# Patient Record
Sex: Female | Born: 1985 | Race: Black or African American | Hispanic: No | Marital: Single | Smoking: Former smoker
Health system: Southern US, Community
[De-identification: ages and names within clinical notes are randomized; demographics above are authoritative.]

## PROBLEM LIST (undated history)

## (undated) DIAGNOSIS — R600 Localized edema: Secondary | ICD-10-CM

## (undated) DIAGNOSIS — T7840XA Allergy, unspecified, initial encounter: Secondary | ICD-10-CM

## (undated) HISTORY — PX: CYST REMOVAL TRUNK: SHX6283

## (undated) HISTORY — DX: Allergy, unspecified, initial encounter: T78.40XA

---
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
## 2003-03-08 ENCOUNTER — Emergency Department (HOSPITAL_COMMUNITY): Admission: AD | Admit: 2003-03-08 | Discharge: 2003-03-08 | Payer: Self-pay | Admitting: Family Medicine

## 2004-03-28 ENCOUNTER — Other Ambulatory Visit: Admission: RE | Admit: 2004-03-28 | Discharge: 2004-03-28 | Payer: Self-pay | Admitting: Internal Medicine

## 2008-02-05 HISTORY — PX: BREAST BIOPSY: SHX20

## 2012-11-29 ENCOUNTER — Encounter (HOSPITAL_COMMUNITY): Payer: Self-pay | Admitting: Emergency Medicine

## 2012-11-29 ENCOUNTER — Emergency Department (HOSPITAL_COMMUNITY)
Admission: EM | Admit: 2012-11-29 | Discharge: 2012-11-29 | Disposition: A | Discharge status: Home / Self Care | Payer: No Typology Code available for payment source | Attending: Emergency Medicine | Admitting: Emergency Medicine

## 2012-11-29 DIAGNOSIS — S0180XA Unspecified open wound of other part of head, initial encounter: Secondary | ICD-10-CM | POA: Insufficient documentation

## 2012-11-29 DIAGNOSIS — J3489 Other specified disorders of nose and nasal sinuses: Secondary | ICD-10-CM | POA: Insufficient documentation

## 2012-11-29 DIAGNOSIS — S7000XA Contusion of unspecified hip, initial encounter: Secondary | ICD-10-CM | POA: Insufficient documentation

## 2012-11-29 DIAGNOSIS — Y92410 Unspecified street and highway as the place of occurrence of the external cause: Secondary | ICD-10-CM | POA: Insufficient documentation

## 2012-11-29 DIAGNOSIS — Y9389 Activity, other specified: Secondary | ICD-10-CM | POA: Insufficient documentation

## 2012-11-29 DIAGNOSIS — Z87891 Personal history of nicotine dependence: Secondary | ICD-10-CM | POA: Insufficient documentation

## 2012-11-29 DIAGNOSIS — S0181XA Laceration without foreign body of other part of head, initial encounter: Secondary | ICD-10-CM

## 2012-11-29 DIAGNOSIS — S7002XA Contusion of left hip, initial encounter: Secondary | ICD-10-CM

## 2012-11-29 MED ORDER — IBUPROFEN 600 MG PO TABS: 600.0000 mg | ORAL_TABLET | Freq: Four times a day (QID) | ORAL | Status: DC | PRN

## 2012-11-29 MED ORDER — IBUPROFEN 800 MG PO TABS
800.0000 mg | ORAL_TABLET | Freq: Once | ORAL | Status: AC
  Administered 2012-11-29: 800 mg via ORAL
  Filled 2012-11-29: qty 1

## 2012-11-29 MED ORDER — HYDROCODONE-ACETAMINOPHEN 5-325 MG PO TABS
1.0000 | ORAL_TABLET | Freq: Once | ORAL | Status: AC
  Administered 2012-11-29: 1 via ORAL
  Filled 2012-11-29: qty 1

## 2012-11-29 MED ORDER — CYCLOBENZAPRINE HCL 10 MG PO TABS: 10.0000 mg | ORAL_TABLET | Freq: Two times a day (BID) | ORAL | Status: DC | PRN

## 2012-11-29 MED ORDER — TRAMADOL HCL 50 MG PO TABS: 50.0000 mg | ORAL_TABLET | Freq: Four times a day (QID) | ORAL | Status: DC | PRN

## 2012-11-29 NOTE — ED Notes (Signed)
Pt walked in hallway and states that "it just feels sore." Pt also states that it hurts when she leans on it but doesn't other then that.

## 2012-11-29 NOTE — ED Notes (Signed)
Nurse called to room.  Pt has decided to have sutures in lieu of glue.  Needs pain med.  Notified PA.
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
## 2012-11-30 ENCOUNTER — Encounter (HOSPITAL_COMMUNITY): Payer: Self-pay | Admitting: Emergency Medicine

## 2012-11-30 ENCOUNTER — Emergency Department (HOSPITAL_COMMUNITY): Payer: No Typology Code available for payment source

## 2012-11-30 ENCOUNTER — Emergency Department (HOSPITAL_COMMUNITY)
Admission: EM | Admit: 2012-11-30 | Discharge: 2012-11-30 | Disposition: A | Discharge status: Home / Self Care | Payer: No Typology Code available for payment source | Attending: Emergency Medicine | Admitting: Emergency Medicine

## 2012-11-30 DIAGNOSIS — R209 Unspecified disturbances of skin sensation: Secondary | ICD-10-CM | POA: Insufficient documentation

## 2012-11-30 DIAGNOSIS — Z87891 Personal history of nicotine dependence: Secondary | ICD-10-CM | POA: Insufficient documentation

## 2012-11-30 DIAGNOSIS — M25559 Pain in unspecified hip: Secondary | ICD-10-CM | POA: Insufficient documentation

## 2012-11-30 DIAGNOSIS — G8911 Acute pain due to trauma: Secondary | ICD-10-CM | POA: Insufficient documentation

## 2012-11-30 MED ORDER — NAPROXEN 500 MG PO TABS: 500.0000 mg | ORAL_TABLET | Freq: Two times a day (BID) | ORAL | Status: DC

## 2012-11-30 MED ORDER — METHOCARBAMOL 500 MG PO TABS: 500.0000 mg | ORAL_TABLET | Freq: Two times a day (BID) | ORAL | Status: DC

## 2012-11-30 MED ORDER — HYDROCODONE-ACETAMINOPHEN 5-325 MG PO TABS: 1.0000 | ORAL_TABLET | Freq: Four times a day (QID) | ORAL | Status: DC | PRN
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
## 2012-12-03 NOTE — ED Provider Notes (Signed)
Medical screening examination/treatment/procedure(s) were performed by non-physician practitioner and as supervising physician I was immediately available for consultation/collaboration.  EKG Interpretation   None         Suzi Roots, MD 12/03/12 [phone]

## 2012-12-05 NOTE — ED Provider Notes (Signed)
Medical screening examination/treatment/procedure(s) were performed by non-physician practitioner and as supervising physician I was immediately available for consultation/collaboration.  EKG Interpretation   None       Devoria Albe, MD, Armando Gang   Ward Givens, MD 12/05/12 1538

## 2013-02-23 ENCOUNTER — Other Ambulatory Visit: Payer: Self-pay | Admitting: Physician Assistant

## 2013-02-23 ENCOUNTER — Other Ambulatory Visit (HOSPITAL_COMMUNITY)
Admission: RE | Admit: 2013-02-23 | Discharge: 2013-02-23 | Disposition: A | Discharge status: Home / Self Care | Payer: No Typology Code available for payment source | Source: Ambulatory Visit | Attending: Family Medicine | Admitting: Family Medicine

## 2013-02-23 DIAGNOSIS — Z124 Encounter for screening for malignant neoplasm of cervix: Secondary | ICD-10-CM | POA: Insufficient documentation
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
## 2014-08-17 ENCOUNTER — Other Ambulatory Visit: Payer: Self-pay | Admitting: Obstetrics and Gynecology

## 2014-08-17 ENCOUNTER — Other Ambulatory Visit (HOSPITAL_COMMUNITY)
Admission: RE | Admit: 2014-08-17 | Discharge: 2014-08-17 | Disposition: A | Discharge status: Home / Self Care | Payer: 59 | Source: Ambulatory Visit | Attending: Obstetrics and Gynecology | Admitting: Obstetrics and Gynecology

## 2014-08-17 DIAGNOSIS — Z1151 Encounter for screening for human papillomavirus (HPV): Secondary | ICD-10-CM | POA: Insufficient documentation

## 2014-08-17 DIAGNOSIS — Z01419 Encounter for gynecological examination (general) (routine) without abnormal findings: Secondary | ICD-10-CM | POA: Diagnosis not present

## 2014-08-22 LAB — CYTOLOGY - PAP

## 2016-11-15 ENCOUNTER — Encounter: Payer: Self-pay | Admitting: Internal Medicine

## 2016-11-19 NOTE — Progress Notes (Signed)
Complete Physical  Assessment and Plan: Health Maintenance- Discussed STD testing, safe sex, alcohol and drug awareness, drinking and driving dangers, wearing a seat belt and general safety measures for young adult.  Adriana Carr was seen today for new patient (initial visit).  Diagnoses and all orders for this visit:  Encounter for routine history and physical exam in female      -      OBGYN referral requested and provided  Establishing care with new doctor, encounter for       Eczema, unspecified type      -      Minor, currently managed without prescriptions  Screening examination for pulmonary tuberculosis -     PPD  Medication management -     CBC with Differential/Platelet -     BASIC METABOLIC PANEL WITH GFR -     Hepatic function panel -     Urinalysis w microscopic + reflex cultur  Fatigue, unspecified type -     Iron,Total/Total Iron Binding Cap -     Ferritin -     CBC -     TSH  Screening for deficiency anemia -     CBC with Differential/Platelet -     Vitamin B12 -     Iron,Total/Total Iron Binding Cap -     Ferritin  Screening cholesterol level -     Lipid panel  Screening for cardiovascular condition -     CBC with Differential/Platelet -     BASIC METABOLIC PANEL WITH GFR -     Lipid panel  Screening for diabetes mellitus -     Hemoglobin A1c  Screening for hematuria or proteinuria -     Urinalysis w microscopic + reflex cultur  Screening for thyroid disorder -     TSH  Vitamin D deficiency -     VITAMIN D 25 Hydroxy (Vit-D Deficiency, Fractures)  Other fatigue -     TSH -     VITAMIN D 25 Hydroxy (Vit-D Deficiency, Fractures) -     Vitamin B12 -     Iron,Total/Total Iron Binding Cap -     Ferritin  Screening for STDs (sexually transmitted diseases) -     HIV antibody -     RPR -     C. trachomatis/N. gonorrhoeae RNA  BMI 38.0-38.9,adult       -     Current weight 214 lb reportedly down from 236 lb; new goal 200 lb       -      Discussed current diet/lifestyle       -     Patient to taper off of mountain dew over the next month       -     Start food diary       -     Information for support medications provided, will initiate at follow up for weight in 4 weeks  Discussed med's effects and SE's. Screening labs and tests as requested with regular follow-up as recommended. 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 She endorses some fatigue that she attributes to this. Her past medical history includes mild eczema ongoing from childhood that she treats with OTC agents, and a lump to her left breast noted 8 years ago which was screened via [place] and [facility] then biopsied; she reports this was "keloidal" and was not recommended any follow up. She does request a referral for GYN and breast care which will be provided today.   BMI is Body mass index is 38.05 kg/m., she is working on diet and exercise, and reportedly has lost 22 lbs since the beginning of the summer through a weight loss clinic. She is interested in continuing with weight loss support through this clinic. She reports she was placed on phentermine and also a "water pill" to assist with weight loss.   Wt Readings from Last 3 Encounters:  11/21/16 214 lb 12.8 oz (97.4 kg)   Diet: avoids sweets, breads, fried foods. She feels her problem is sweetened drink- mountain dew daily (16.9 oz x 1-2 per day). Discussed cutting this out. She would like to set an initial goal of 14 lb weight loss.   She does workout. Dancing, zumba,  cardio at gym. Denies CP, SOB.   Current Medications:  Current Outpatient Prescriptions on File Prior to Visit  Medication Sig Dispense Refill  . ibuprofen (ADVIL,MOTRIN) 600 MG tablet Take 1 tablet (600 mg total) by mouth every 6 (six) hours as needed for pain. 30 tablet 0   No current facility-administered medications on file prior to visit.    Health Maintenance:   Immunization History  Administered Date(s) Administered  . PPD Test 11/21/2016  . Td 02/05/2012    TD/TDAP: 2015 Influenza: Declines Pneumovax: n/a Prevnar 13: n/a HPV vaccines: n/a  LMP: 11/01/2016 Sexually Active: last in 09/2016 STD testing offered - will obtain what we can without pelvic today Pap: 2017 in [place] MGM: Bilateral diagnostic 2013 related to L breast lump  Allergies: No Known Allergies Medical History:  has Eczema; Fatigue; and BMI 38.0-38.9,adult on her problem list. Surgical History:  She  has a past surgical history that includes Cyst removal trunk (lower back) and Breast biopsy (Left, 2010). Family History:  Her She was adopted. Family history is unknown by patient. Social History:   reports that she quit smoking about 13 years ago. She has never used smokeless tobacco. She reports that she does not drink alcohol or use drugs.  Review of Systems: Review of Systems  Constitutional: Positive for malaise/fatigue. Negative for weight loss.  HENT: Negative for congestion, hearing loss, nosebleeds and tinnitus.   Eyes: Negative for blurred vision.  Respiratory: Negative for cough, shortness of breath and wheezing.   Cardiovascular: Negative for chest pain, palpitations and leg swelling.  Gastrointestinal: Negative for abdominal pain, blood in stool, constipation, diarrhea, heartburn, melena, nausea and vomiting.  Genitourinary: Negative.   Musculoskeletal: Negative for joint pain and myalgias.  Skin: Negative.  Negative for rash.  Neurological: Negative for dizziness, sensory change and  headaches.  Endo/Heme/Allergies: Positive for environmental allergies. Negative for polydipsia. Does not bruise/bleed easily.  Psychiatric/Behavioral: Negative.  Negative for depression and substance abuse. The patient is not nervous/anxious and does not have insomnia.     Physical Exam: Estimated body mass index is 38.05 kg/m as calculated from the following:   Height as of this encounter:  (1.6 m).   Weight as of this encounter: 214 lb 12.8 oz (97.4 kg). BP 122/76   Pulse  76   Temp (!) 97 F (36.1 C)   Resp 18   Ht  (1.6 m)   Wt 214 lb 12.8 oz (97.4 kg)   BMI 38.05 kg/m  General Appearance: Well nourished, overweight female in no apparent distress.  Eyes: PERRLA, EOMs, conjunctiva no swelling or erythema, normal fundi and vessels.  Sinuses: No Frontal/maxillary tenderness  ENT/Mouth: Ext aud canals clear, normal light reflex with TMs without erythema, bulging. Good dentition. No erythema, swelling, or exudate on post pharynx. Tonsils not swollen or erythematous. Hearing normal.  Neck: Supple, thyroid normal. No bruits  Respiratory: Respiratory effort normal, BS equal bilaterally without rales, rhonchi, wheezing or stridor.  Cardio: RRR without murmurs, rubs or gallops. Brisk peripheral pulses without edema.  Chest: symmetric, with normal excursions and percussion.  Breasts: Defer to GYN Abdomen: Soft, nontender, no guarding, rebound, hernias, masses, or organomegaly.  Lymphatics: Non tender without lymphadenopathy.  Genitourinary: Defer to GYN Musculoskeletal: Full ROM all peripheral extremities,5/5 strength, and normal gait.  Skin: Warm, dry without rashes, lesions, ecchymosis. Neuro: Cranial nerves intact, reflexes equal bilaterally. Normal muscle tone, no cerebellar symptoms. Sensation intact.  Psych: Awake and oriented X 3, normal affect, Insight and Judgment appropriate.   PHQ-2: 0  EKG: defer  Dan Maker 10:17 PM [facility]

## 2016-11-21 ENCOUNTER — Encounter: Payer: Self-pay | Admitting: Adult Health

## 2016-11-21 ENCOUNTER — Ambulatory Visit (INDEPENDENT_AMBULATORY_CARE_PROVIDER_SITE_OTHER): Payer: BC Managed Care – PPO | Admitting: Adult Health

## 2016-11-21 VITALS — BP 122/76 | HR 76 | Temp 97.0°F | Resp 18 | Ht 63.0 in | Wt 214.8 lb

## 2016-11-21 DIAGNOSIS — Z1389 Encounter for screening for other disorder: Secondary | ICD-10-CM

## 2016-11-21 DIAGNOSIS — R5383 Other fatigue: Secondary | ICD-10-CM

## 2016-11-21 DIAGNOSIS — Z111 Encounter for screening for respiratory tuberculosis: Secondary | ICD-10-CM

## 2016-11-21 DIAGNOSIS — Z6838 Body mass index (BMI) 38.0-38.9, adult: Secondary | ICD-10-CM

## 2016-11-21 DIAGNOSIS — Z130 Encounter for screening for diseases of the blood and blood-forming organs and certain disorders involving the immune mechanism: Secondary | ICD-10-CM

## 2016-11-21 DIAGNOSIS — Z7689 Persons encountering health services in other specified circumstances: Secondary | ICD-10-CM

## 2016-11-21 DIAGNOSIS — Z1329 Encounter for screening for other suspected endocrine disorder: Secondary | ICD-10-CM

## 2016-11-21 DIAGNOSIS — Z6841 Body Mass Index (BMI) 40.0 and over, adult: Secondary | ICD-10-CM | POA: Insufficient documentation

## 2016-11-21 DIAGNOSIS — Z1159 Encounter for screening for other viral diseases: Secondary | ICD-10-CM

## 2016-11-21 DIAGNOSIS — Z0000 Encounter for general adult medical examination without abnormal findings: Secondary | ICD-10-CM | POA: Diagnosis not present

## 2016-11-21 DIAGNOSIS — L309 Dermatitis, unspecified: Secondary | ICD-10-CM

## 2016-11-21 DIAGNOSIS — Z136 Encounter for screening for cardiovascular disorders: Secondary | ICD-10-CM

## 2016-11-21 DIAGNOSIS — Z79899 Other long term (current) drug therapy: Secondary | ICD-10-CM

## 2016-11-21 DIAGNOSIS — E559 Vitamin D deficiency, unspecified: Secondary | ICD-10-CM | POA: Diagnosis not present

## 2016-11-21 DIAGNOSIS — Z131 Encounter for screening for diabetes mellitus: Secondary | ICD-10-CM

## 2016-11-21 DIAGNOSIS — Z113 Encounter for screening for infections with a predominantly sexual mode of transmission: Secondary | ICD-10-CM

## 2016-11-21 DIAGNOSIS — Z13220 Encounter for screening for lipoid disorders: Secondary | ICD-10-CM

## 2016-11-21 NOTE — Patient Instructions (Signed)
Taper down on soda until no longer drinking on a regular basis.   Who Qualifies for Obesity Medications? Although everyone is hopeful for a fast and easy way to lose weight, nothing has been shown to replace a prudent, calorie-controlled diet along with behavior modification as a cornerstone for all obesity treatments.  The next tool that can be used to achieve weight-loss and health improvement is medication. Pharmacotherapy may be offered to individuals affected by obesity who have failed to achieve weight-loss through diet and exercise alone. Currently there are several drugs that are approved by the FDA for weight-loss: phentermine products (Adipex-P or Suprenza)  lorcaserin HCI (Belviq) phentermine- topiramate ER (Qsymia)  Bupropion; Naltrexone ER (Contrave)  Saxenda (liraglutide), once a day weight loss shot Let's take a closer look at each of these medications and learn how they work:  Phentermine (Adipex-P or Suprenza) How does it work? Phentermine is a medication available by prescription that works on chemicals in the brain to decrease your appetite. It also has a mild stimulant component that adds extra energy. Phentermine is a pill that is taken once a day in the morning time. Tolerance to this medication can develop, so it can only be used for several months at a time. Common side effects are dry mouth, sleeplessness, constipation. Weight-loss: The average weight-loss is 4-5 percent of your weight after one-year. In a 200 pound person, this means about 10 pounds of weight-loss. Patients who receive phentermine can usually expect to see greater weight-loss than those who receive non-pharmacologic care, on average about 13 pounds difference over 12 weeks as reported in one study. Concerns: Due to its stimulant effect, a person's blood pressure and heart rate may increase when on this medication; therefore, you must be monitored closely by a physician who is experienced in  prescribing this medication. It cannot be used in patients with some heart conditions (such as poorly controlled blood pressure), glaucoma (increased pressure in your eye), stroke or overactive thyroid. There is some concern for abuse, but this is minimal if the medication is appropriately used as directed by a healthcare professional.  Lorcaserin (Belviq) How does it work? Lorcaserin was approved in June 2012 by the FDA and became commercially available in June 2013. It works by helping you feel full while eating less, and it works on the chemicals in your brain to help decrease your appetite. Weight-loss: In individuals who took the medication for one-year, it has been shown to have an average of 7 percent weight-loss. In a 200 pound person, this would mean a 14 pound weight-loss. Blood sugar, cholesterol and blood pressure levels have also been shown to improve. Concerns: The most common side effects are headache, dizziness, fatigue, dry mouth, upper UEA:[id number] tract infection and nausea.  Response to therapy should be evaluated by week 12.  If a patient has not lost at least 5% of baseline body weigh  Phentermine-Topiramate ER (Qsymia) How does it work? This combination medication was approved by the FDA in July 2012. Topiramate is a medication used to treat seizures. It was found that a common side effect of this medication was weight-loss. Phentermine, as described in this brochure, helps to increase your energy and decrease your appetite. Weight-loss: Among individuals who took the highest does of Qsymia (15 mg phentermine and 92 mg of topiramate ER) for one-year, they achieved an average of 14.4 percent weight-loss. In a 200 pound person, a 14.4 percent weight-loss would mean a loss of 29 pounds. Cholesterol levels have also  been shown to improve. Concerns: The most common side effects were dry mouth, constipation and pins-and-needle feeling in extremities. Qsymia should NOT be taken  during pregnancy since Topiramate ER, a component of Qsymia, has been associated with an increased risk of birth defects.  Bupropion; Naltrexone ER (Contrave) How does it work? Works in two areas of your brain, hunger center and reward center to reduce hunger and cravings.  Weight loss In a 56 week trial patients lost more than 5% of their body weight.  Concerns Most common side effects are dry mouth, constipation or diarrhea, headache.  Please take it with a full glass of water and low fat meal.      Follow-up Visits: Patients are given the opportunity to revisit a topic or obtain more information on an area of interest during follow-up visits. The frequency of and interval between follow-up visits is determined on a patient-by-patient basis. Frequent visits (every 3 to 4 weeks) are encouraged until initial weight-loss goals (5 to 10 percent of body weight) are achieved. At that point, less frequent visits are typically scheduled as needed for individual patients. However, since obesity is considered a chronic life-long problem for many individuals, periodic continual follow up is recommended.   Research has shown that weight-loss as low as 5 percent of initial body weight can lead to favorable improvements in blood pressure, cholesterol, glucose levels and insulin sensitivity. The risk of developing heart disease is reduced the most in patients who have impaired glucose tolerance, type 2 diabetes or high blood pressure.    Before you even begin to attack a weight-loss plan, it pays to remember this: You are not fat. You have fat. Losing weight isn't about blame or shame; it's simply another achievement to accomplish. Dieting is like any other skill-you have to buckle down and work at it. As long as you act in a smart, reasonable way, you'll ultimately get where you want to be. Here are some weight loss pearls for you.  1. It's Not a Diet. It's a Lifestyle Thinking of a diet as something  you're on and suffering through only for the short term doesn't work. To shed weight and keep it off, you need to make permanent changes to the way you eat. It's OK to indulge occasionally, of course, but if you cut calories temporarily and then revert to your old way of eating, you'll gain back the weight quicker than you can say yo-yo. Use it to lose it. Research shows that one of the best predictors of long-term weight loss is how many pounds you drop in the first month. For that reason, nutritionists often suggest being stricter for the first two weeks of your new eating strategy to build momentum. Cut out added sugar and alcohol and avoid unrefined carbs. After that, figure out how you can reincorporate them in a way that's healthy and maintainable.  2. There's a Right Way to Exercise Working out burns calories and fat and boosts your metabolism by building muscle. But those trying to lose weight are notorious for overestimating the number of calories they burn and underestimating the amount they take in. Unfortunately, your system is biologically programmed to hold on to extra pounds and that means when you start exercising, your body senses the deficit and ramps up its hunger signals. If you're not diligent, you'll eat everything you burn and then some. Use it to lose it. Cardio gets all the exercise glory, but strength and interval training are the real heroes.  They help you build lean muscle, which in turn increases your metabolism and calorie-burning ability 3. Don't Overreact to Mild Hunger Some people have a hard time losing weight because of hunger anxiety. To them, being hungry is bad-something to be avoided at all costs-so they carry snacks with them and eat when they don't need to. Others eat because they're stressed out or bored. While you never want to get to the point of being ravenous (that's when bingeing is likely to happen), a hunger pang, a craving, or the fact that it's 3:00 p.m.  should not send you racing for the vending machine or obsessing about the energy bar in your purse. Ideally, you should put off eating until your stomach is growling and it's difficult to concentrate.  Use it to lose it. When you feel the urge to eat, use the HALT method. Ask yourself, Am I really hungry? Or am I angry or anxious, lonely or bored, or tired? If you're still not certain, try the apple test. If you're truly hungry, an apple should seem delicious; if it doesn't, something else is going on. Or you can try drinking water and making yourself busy, if you are still hungry try a healthy snack.  4. Not All Calories Are Created Equal The mechanics of weight loss are pretty simple: Take in fewer calories than you use for energy. But the kind of food you eat makes all the difference. Processed food that's high in saturated fat and refined starch or sugar can cause inflammation that disrupts the hormone signals that tell your brain you're full. The result: You eat a lot more.  Use it to lose it. Clean up your diet. Swap in whole, unprocessed foods, including vegetables, lean protein, and healthy fats that will fill you up and give you the biggest nutritional bang for your calorie buck. In a few weeks, as your brain starts receiving regular hunger and fullness signals once again, you'll notice that you feel less hungry overall and naturally start cutting back on the amount you eat.  5. Protein, Produce, and Plant-Based Fats Are Your Weight-Loss Trinity Here's why eating the three Ps regularly will help you drop pounds. Protein fills you up. You need it to build lean muscle, which keeps your metabolism humming so that you can torch more fat. People in a weight-loss program who ate double the recommended daily allowance for protein (about 110 grams for a 150-pound woman) lost 70 percent of their weight from fat, while people who ate the RDA lost only about 40 percent, one study found. Produce is packed with  filling fiber. "It's very difficult to consume too many calories if you're eating a lot of vegetables. Example: Three cups of broccoli is a lot of food, yet only 93 calories. (Fruit is another story. It can be easy to overeat and can contain a lot of calories from sugar, so be sure to monitor your intake.) Plant-based fats like olive oil and those in avocados and nuts are healthy and extra satiating.  Use it to lose it. Aim to incorporate each of the three Ps into every meal and snack. People who eat protein throughout the day are able to keep weight off, according to a study in the American Journal of Clinical Nutrition. In addition to meat, poultry and seafood, good sources are beans, lentils, eggs, tofu, and yogurt. As for fat, keep portion sizes in check by measuring out salad dressing, oil, and nut butters (shoot for one to two tablespoons).  Finally, eat veggies or a little fruit at every meal. People who did that consumed 308 fewer calories but didn't feel any hungrier than when they didn't eat more produce.  7. How You Eat Is As Important As What You Eat In order for your brain to register that you're full, you need to focus on what you're eating. Sit down whenever you eat, preferably at a table. Turn off the TV or computer, put down your phone, and look at your food. Smell it. Chew slowly, and don't put another bite on your fork until you swallow. When women ate lunch this attentively, they consumed 30 percent less when snacking later than those who listened to an audiobook at lunchtime, according to a study in the [place] Journal of Nutrition. 8. Weighing Yourself Really Works The scale provides the best evidence about whether your efforts are paying off. Seeing the numbers tick up or down or stagnate is motivation to keep going-or to rethink your approach. A 2015 study at [facility] found that daily weigh-ins helped people lose more weight, keep it off, and maintain that loss, even after two  years. Use it to lose it. Step on the scale at the same time every day for the best results. If your weight shoots up several pounds from one weigh-in to the next, don't freak out. Eating a lot of salt the night before or having your period is the likely culprit. The number should return to normal in a day or two. It's a steady climb that you need to do something about. 9. Too Much Stress and Too Little Sleep Are Your Enemies When you're tired and frazzled, your body cranks up the production of cortisol, the stress hormone that can cause carb cravings. Not getting enough sleep also boosts your levels of ghrelin, a hormone associated with hunger, while suppressing leptin, a hormone that signals fullness and satiety. People on a diet who slept only five and a half hours a night for two weeks lost 55 percent less fat and were hungrier than those who slept eight and a half hours, according to a study in the [place] [employer] Journal. Use it to lose it. Prioritize sleep, aiming for seven hours or more a night, which research shows helps lower stress. And make sure you're getting quality zzz's. If a snoring spouse or a fidgety cat wakes you up frequently throughout the night, you may end up getting the equivalent of just four hours of sleep, according to a study from [facility]. Keep pets out of the bedroom, and use a white-noise app to drown out snoring. 10. You Will Hit a plateau-And You Can Bust Through It As you slim down, your body releases much less leptin, the fullness hormone.  If you're not strength training, start right now. Building muscle can raise your metabolism to help you overcome a plateau. To keep your body challenged and burning calories, incorporate new moves and more intense intervals into your workouts or add another sweat session to your weekly routine. Alternatively, cut an extra 100 calories or so a day from your diet. Now that you've lost weight, your body simply doesn't  need as much fuel.

## 2016-11-22 LAB — CBC WITH DIFFERENTIAL/PLATELET
Basophils Absolute: 44 cells/uL (ref 0–200)
Basophils Relative: 0.5 %
EOS PCT: 1.3 %
Eosinophils Absolute: 114 cells/uL (ref 15–500)
HCT: 37.2 % (ref 35.0–45.0)
HEMOGLOBIN: 12.4 g/dL (ref 11.7–15.5)
Lymphs Abs: 3212 cells/uL (ref 850–3900)
MCH: 27.6 pg (ref 27.0–33.0)
MCHC: 33.3 g/dL (ref 32.0–36.0)
MCV: 82.9 fL (ref 80.0–100.0)
MPV: 10.2 fL (ref 7.5–12.5)
Monocytes Relative: 5.9 %
NEUTROS ABS: 4910 {cells}/uL (ref 1500–7800)
Neutrophils Relative %: 55.8 %
Platelets: 405 10*3/uL — ABNORMAL HIGH (ref 140–400)
RBC: 4.49 10*6/uL (ref 3.80–5.10)
RDW: 12 % (ref 11.0–15.0)
Total Lymphocyte: 36.5 %
WBC mixed population: 519 cells/uL (ref 200–950)
WBC: 8.8 10*3/uL (ref 3.8–10.8)

## 2016-11-22 LAB — HIV ANTIBODY (ROUTINE TESTING W REFLEX): HIV 1&2 Ab, 4th Generation: NONREACTIVE

## 2016-11-22 LAB — HEPATIC FUNCTION PANEL
AG RATIO: 1.4 (calc) (ref 1.0–2.5)
ALBUMIN MSPROF: 4.4 g/dL (ref 3.6–5.1)
ALT: 10 U/L (ref 6–29)
AST: 13 U/L (ref 10–30)
Alkaline phosphatase (APISO): 69 U/L (ref 33–115)
BILIRUBIN TOTAL: 0.2 mg/dL (ref 0.2–1.2)
Bilirubin, Direct: 0 mg/dL (ref 0.0–0.2)
Globulin: 3.1 g/dL (calc) (ref 1.9–3.7)
Indirect Bilirubin: 0.2 mg/dL (calc) (ref 0.2–1.2)
TOTAL PROTEIN: 7.5 g/dL (ref 6.1–8.1)

## 2016-11-22 LAB — LIPID PANEL
Cholesterol: 147 mg/dL (ref <200)
HDL: 51 mg/dL (ref >50)
LDL Cholesterol (Calc): 79 mg/dL (calc)
Non-HDL Cholesterol (Calc): 96 mg/dL (calc) (ref <130)
Total CHOL/HDL Ratio: 2.9 (calc) (ref <5.0)
Triglycerides: 87 mg/dL (ref <150)

## 2016-11-22 LAB — RPR: RPR Ser Ql: NONREACTIVE

## 2016-11-22 LAB — URINALYSIS W MICROSCOPIC + REFLEX CULTURE
BILIRUBIN URINE: NEGATIVE
Bacteria, UA: NONE SEEN /HPF
Glucose, UA: NEGATIVE
Hgb urine dipstick: NEGATIVE
Hyaline Cast: NONE SEEN /LPF
KETONES UR: NEGATIVE
LEUKOCYTE ESTERASE: NEGATIVE
NITRITES URINE, INITIAL: NEGATIVE
PH: 6 (ref 5.0–8.0)
Protein, ur: NEGATIVE
RBC / HPF: NONE SEEN /HPF (ref 0–2)
SPECIFIC GRAVITY, URINE: 1.025 (ref 1.001–1.03)
WBC, UA: NONE SEEN /HPF (ref 0–5)

## 2016-11-22 LAB — VITAMIN B12: VITAMIN B 12: 370 pg/mL (ref 200–1100)

## 2016-11-22 LAB — TEST AUTHORIZATION

## 2016-11-22 LAB — BASIC METABOLIC PANEL WITH GFR
BUN: 12 mg/dL (ref 7–25)
CALCIUM: 9.4 mg/dL (ref 8.6–10.2)
CO2: 25 mmol/L (ref 20–32)
CREATININE: 0.76 mg/dL (ref 0.50–1.10)
Chloride: 103 mmol/L (ref 98–110)
GFR, EST NON AFRICAN AMERICAN: 103 mL/min/{1.73_m2} (ref >=60)
GFR, Est African American: 119 mL/min/{1.73_m2} (ref >=60)
Glucose, Bld: 89 mg/dL (ref 65–99)
Potassium: 4 mmol/L (ref 3.5–5.3)
Sodium: 136 mmol/L (ref 135–146)

## 2016-11-22 LAB — HEMOGLOBIN A1C
EAG (MMOL/L): 6 (calc)
Hgb A1c MFr Bld: 5.4 % of total Hgb (ref <5.7)
MEAN PLASMA GLUCOSE: 108 (calc)

## 2016-11-22 LAB — IRON,TIBC AND FERRITIN PANEL
%SAT: 14 % (calc) (ref 11–50)
FERRITIN: 32 ng/mL (ref 10–154)
IRON: 49 ug/dL (ref 40–190)
TIBC: 341 ug/dL (ref 250–450)

## 2016-11-22 LAB — NO CULTURE INDICATED

## 2016-11-22 LAB — VITAMIN D 25 HYDROXY (VIT D DEFICIENCY, FRACTURES): VIT D 25 HYDROXY: 12 ng/mL — AB (ref 30–100)

## 2016-11-22 LAB — TSH: TSH: 1.97 mIU/L

## 2016-11-23 ENCOUNTER — Encounter: Payer: Self-pay | Admitting: Adult Health

## 2016-11-23 DIAGNOSIS — R7989 Other specified abnormal findings of blood chemistry: Secondary | ICD-10-CM | POA: Insufficient documentation

## 2016-11-23 DIAGNOSIS — E559 Vitamin D deficiency, unspecified: Secondary | ICD-10-CM | POA: Insufficient documentation

## 2016-11-23 LAB — C. TRACHOMATIS/N. GONORRHOEAE RNA
C. TRACHOMATIS RNA, TMA: NOT DETECTED
N. gonorrhoeae RNA, TMA: NOT DETECTED

## 2016-11-25 ENCOUNTER — Telehealth: Payer: Self-pay

## 2016-11-26 NOTE — Telephone Encounter (Signed)
See previous encounter

## 2016-12-24 NOTE — Progress Notes (Signed)
Assessment and Plan:  Morbid Obesity with co morbidities Long discussion about weight loss, diet, and exercise Discussed goal weight (below 170) and initial weight loss goal (200 lb) Patient will work on increasing fluids to 80-100 + fluid ounces, tracking calories in app Will start the patient on phentermine- hand out given and AE's discussed, will do close follow up. Return in 3 months.  -     phentermine (ADIPEX-P) 37.5 MG tablet; Take 1 tablet (37.5 mg total) by mouth daily before breakfast. Take 1/3-1/2 tab few days to a week, titrate up to 1 tab in the morning as tolerated. May take an additional 1/3-1/2 at lunch to cover evening, if tolerated without sleep disturbances.  Fluid retention Restarting previous medication after verification of dose -     triamterene-hydrochlorothiazide (MAXZIDE) 75-50 MG tablet; Take 1 tablet by mouth daily.  Further disposition pending results of labs. Discussed med's effects and SE's.   Over 15 minutes of exam, counseling, chart review, and critical decision making was performed.   No future appointments.  ------------------------------------------------------------------------------------------------------------------   HPI BP 124/74   Pulse 96   Temp (!) 97.5 F (36.4 C)   Ht 5\' 3"  (1.6 m)   Wt 218 lb 9.6 oz (99.2 kg)   LMP 11/29/2016 (Exact Date)   SpO2 99%   BMI 38.72 kg/m   31 y.o.female, recently established at this clinic after moving back to the area presents for monitoring of weight and discussion for initiating phentermine for weight loss support. She was previously managed at a weight loss clinic over the summer and reportedly lost lost 22 lbs since the beginning of the summer while on phentermine; she denies having palpitations, anxiety, trouble sleeping, elevated BP while on the medication.   She is interested in continuing with weight loss support through this clinic. She was also previously on a medication for fluid retention which  she could not recall at last visit; presents with prescription today for refill.   BMI is Body mass index is 38.72 kg/m., she is working on diet and exercise. Wt Readings from Last 3 Encounters:  12/25/16 218 lb 9.6 oz (99.2 kg)  11/21/16 214 lb 12.8 oz (97.4 kg)   She plans to start attending GYM three times a week - Has cut back on mountain dew, plans to continue tapering and cut out completely by January.  Plans to start herbal life protein shake for breakfast, eating more consistently to avoid extremely large meals.   Past Medical History:  Diagnosis Date  . Allergy      No Known Allergies  Current Outpatient Medications on File Prior to Visit  Medication Sig  . Cholecalciferol (VITAMIN D-3) 5000 units TABS Take by mouth.  Marland Kitchen. ibuprofen (ADVIL,MOTRIN) 600 MG tablet Take 1 tablet (600 mg total) by mouth every 6 (six) hours as needed for pain.  . Multiple Vitamin (MULTIVITAMIN) tablet Take 1 tablet by mouth daily.  . Prenatal Vit-Fe Fumarate-FA (M-VIT) tablet Take 1 tablet by mouth daily.   No current facility-administered medications on file prior to visit.     ROS: Review of Systems  Constitutional: Negative for malaise/fatigue and weight loss.  HENT: Negative for hearing loss and tinnitus.   Eyes: Negative for blurred vision and double vision.  Respiratory: Negative for cough, shortness of breath and wheezing.   Cardiovascular: Negative for chest pain, palpitations, orthopnea, claudication and leg swelling.  Gastrointestinal: Negative for abdominal pain, blood in stool, constipation, diarrhea, heartburn, melena, nausea and vomiting.  Genitourinary: Negative.  Musculoskeletal: Negative for joint pain and myalgias.  Skin: Negative for rash.  Neurological: Negative for dizziness, tingling, sensory change, weakness and headaches.  Endo/Heme/Allergies: Negative for polydipsia.  Psychiatric/Behavioral: Negative.   All other systems reviewed and are negative.    Physical  Exam:  BP 124/74   Pulse 96   Temp (!) 97.5 F (36.4 C)   Ht 5\' 3"  (1.6 m)   Wt 218 lb 9.6 oz (99.2 kg)   LMP 11/29/2016 (Exact Date)   SpO2 99%   BMI 38.72 kg/m   General Appearance: Well nourished, in no apparent distress. Eyes: PERRLA, EOMs, conjunctiva no swelling or erythema Sinuses: No Frontal/maxillary tenderness ENT/Mouth: Ext aud canals clear, TMs without erythema, bulging. No erythema, swelling, or exudate on post pharynx.  Tonsils not swollen or erythematous. Hearing normal.  Neck: Supple, thyroid normal.  Respiratory: Respiratory effort normal, BS equal bilaterally without rales, rhonchi, wheezing or stridor.  Cardio: RRR with no MRGs. Brisk peripheral pulses with mild nonpitting edema to bilateral ankles.  Abdomen: Soft, + BS.  Non tender, no guarding, rebound, hernias, masses. Lymphatics: Non tender without lymphadenopathy.  Musculoskeletal: Full ROM, 5/5 strength, normal gait.  Skin: Warm, dry without rashes, lesions, ecchymosis.  Neuro: Cranial nerves intact. Normal muscle tone, no cerebellar symptoms. Sensation intact.  Psych: Awake and oriented X 3, normal affect, Insight and Judgment appropriate.     Adriana MakerAshley C Jafar Poffenberger, Adriana Carr 4:40 PM [facility]

## 2016-12-25 ENCOUNTER — Ambulatory Visit: Payer: BC Managed Care – PPO | Admitting: Adult Health

## 2016-12-25 ENCOUNTER — Encounter: Payer: Self-pay | Admitting: Adult Health

## 2016-12-25 VITALS — BP 124/74 | HR 96 | Temp 97.5°F | Ht 63.0 in | Wt 218.6 lb

## 2016-12-25 DIAGNOSIS — Z6838 Body mass index (BMI) 38.0-38.9, adult: Secondary | ICD-10-CM | POA: Diagnosis not present

## 2016-12-25 DIAGNOSIS — R609 Edema, unspecified: Secondary | ICD-10-CM | POA: Diagnosis not present

## 2016-12-25 MED ORDER — TRIAMTERENE-HCTZ 75-50 MG PO TABS: 1.0000 | ORAL_TABLET | Freq: Every day | ORAL | 11 refills | Status: DC

## 2016-12-25 MED ORDER — PHENTERMINE HCL 37.5 MG PO TABS: 37.5000 mg | ORAL_TABLET | Freq: Every day | ORAL | 2 refills | Status: DC

## 2016-12-25 NOTE — Patient Instructions (Addendum)
Get a water bottle - drink 80-100 fluid ounces of water or unsweetened tea daily.   Track calories on app - don't go too low - usually minimum 1200  Phentermine  While taking the medication we may ask that you come into the office once a month or once every 2-3 months to monitor your weight, blood pressure, and heart rate. In addition we can help answer your questions about diet, exercise, and help you every step of the way with your weight loss journey. Sometime it is helpful if you bring in a food diary or use an app on your phone such as myfitnesspal to record your calorie intake, especially in the beginning.   You can start out on 1/3 to 1/2 a pill in the morning and if you are tolerating it well you can increase to one pill daily. I also have some patients that take 1/3 or 1/2 at lunch to help prevent night time eating.  This medication [facility] is 14-17 dollars and you do NOT need a membership to get meds from there.    What is this medicine? PHENTERMINE (FEN ter meen) decreases your appetite. This medicine is intended to be used in addition to a healthy reduced calorie diet and exercise. The best results are achieved this way. This medicine is only indicated for short-term use. Eventually your weight loss may level out and the medication will no longer be needed.   How should I use this medicine? Take this medicine by mouth. Follow the directions on the prescription label. The tablets should stay in the bottle until immediately before you take your dose. Take your doses at regular intervals. Do not take your medicine more often than directed.  Overdosage: If you think you have taken too much of this medicine contact a poison control center or emergency room at once. NOTE: This medicine is only for you. Do not share this medicine with others.  What if I miss a dose? If you miss a dose, take it as soon as you can. If it is almost time for your  next dose, take only that dose. Do not take double or extra doses. Do not increase or in any way change your dose without consulting your doctor.  What should I watch for while using this medicine? Notify your physician immediately if you become short of breath while doing your normal activities. Do not take this medicine within 6 hours of bedtime. It can keep you from getting to sleep. Avoid drinks that contain caffeine and try to stick to a regular bedtime every night. Do not stand or sit up quickly, especially if you are an older patient. This reduces the risk of dizzy or fainting spells. Avoid alcoholic drinks.  What side effects may I notice from receiving this medicine? Side effects that you should report to your doctor or health care professional as soon as possible: -chest pain, palpitations -depression or severe changes in mood -increased blood pressure -irritability -nervousness or restlessness -severe dizziness -shortness of breath -problems urinating -unusual swelling of the legs -vomiting  Side effects that usually do not require medical attention (report to your doctor or health care professional if they continue or are bothersome): -blurred vision or other eye problems -changes in sexual ability or desire -constipation or diarrhea -difficulty sleeping -dry mouth or unpleasant taste -headache -nausea This list may not describe all possible side effects. Call your doctor for medical advice about side  effects. You may report side effects to FDA at 1-800-FDA-1088.

## 2017-04-02 NOTE — Progress Notes (Signed)
FOLLOW UP  Assessment and Plan:   Adriana Carr was seen today for follow-up.  Diagnoses and all orders for this visit:  Body mass index (BMI) of 40.0 to 44.9 in adult [facility]) Long discussion about weight loss, diet, and exercise Recommended diet heavy in fruits and veggies and low in animal meats, cheeses, and dairy products, appropriate calorie intake Patient will work on avoiding stress eating and increasing hydration Discussed appropriate weight for height  Will start the patient on phentermine- hand out given and AE's discussed, will do close follow up. .  Follow up in 3 months -     phentermine (ADIPEX-P) 37.5 MG tablet; Take 1 tablet (37.5 mg total) by mouth daily before breakfast. Take 1/2-1 tab as needed daily.  Low vitamin D level Continue with 5000 units supplement daily for goal of 70-100 Will defer recheck to next visit with BMP  Birth control counseling -     etonogestrel-ethinyl estradiol (NUVARING) 0.12-0.015 MG/24HR vaginal ring; Insert vaginally and leave in place for 3 consecutive weeks, then remove for 1 week.  Essential hypertension Monitor blood pressure at home; call if consistently over 130/80 Continue DASH diet.   Reminder to go to the ER if any CP, SOB, nausea, dizziness, severe HA, changes vision/speech, left arm numbness and tingling and jaw pain. -     triamterene-hydrochlorothiazide (MAXZIDE) 75-50 MG tablet; Take 1 tablet by mouth daily.  Boil of left breast Apply hot compresses, monitor closely for improvement, if not better soon will obtain mammogram to verify.  -     doxycycline (VIBRAMYCIN) 100 MG capsule; Take 1 capsule 2 x/day with food for 5 days -  then 1 x/day with food for 10 days  Cough Suspect allergies -  Get on allergy pill, voice rest, suppress cough with OTC sugar free candy and RX med.  Info provided try ranitidine if not improving. Call office if increasingly productive or develop fever.   Continue diet and meds as discussed. Further  disposition pending results of labs. Discussed med's effects and SE's.   Over 30 minutes of exam, counseling, chart review, and critical decision making was performed.   No future appointments.  ----------------------------------------------------------------------------------------------------------------------  HPI 32 y.o. female  presents for 3 month follow up on morbid obesity, hypertension/edema and vitamin D deficiency. She is also requesting birth control management today as she plans to start dating again soon. She reports she has been on nuvaring and the pill before, but is concerned she would forget to take the pill consistently.   She is also concerned about a new boid/lump on her breast x 2 weeks - on L breast at approx 7 O'clock 6 cm from nipple very close to the surface; area is firm with a visible opening at skin but no drainage, heat fluctuance, erythema.   she is prescribed phentermine for weight loss but has not started due to several deaths in the family in the past 3 months. She is still interested in starting this today.   BMI is Body mass index is 40.74 kg/m., she is working on diet and exercise. Has been meal prepping. 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  Patient has started on Vitamin D supplement since last visit at which vitamin D was noted to be very low:    Lab Results  Component Value Date   VD25OH 12 (L) 11/21/2016         Current Medications:  Current Outpatient Medications on File Prior to Visit  Medication Sig  . ibuprofen (ADVIL,MOTRIN) 600 MG tablet Take 1 tablet (600 mg total) by mouth every 6 (six) hours as needed for pain.  . Cholecalciferol (VITAMIN D-3) 5000 units TABS Take by mouth.  . Multiple Vitamin (MULTIVITAMIN) tablet Take 1 tablet by mouth daily.   No current facility-administered medications on file prior to visit.      Allergies: No Known Allergies   Medical History:  Past Medical History:  Diagnosis Date  . Allergy    Family history- Reviewed and unchanged Social history- Reviewed and unchanged   Review of Systems:  Review of Systems  Constitutional: Negative for malaise/fatigue and weight loss.  HENT: Negative for hearing loss and tinnitus.   Eyes: Negative for blurred vision and double vision.  Respiratory: Positive for cough and sputum production. Negative for shortness of breath and wheezing.   Cardiovascular: Negative for chest pain, palpitations, orthopnea, claudication and leg swelling.  Gastrointestinal: Negative for abdominal pain, blood in stool, constipation, diarrhea, heartburn, melena, nausea and vomiting.  Genitourinary: Negative.   Musculoskeletal: Negative for joint pain and myalgias.  Skin: Negative for rash.  Neurological: Negative for dizziness, tingling, sensory change, weakness and headaches.  Endo/Heme/Allergies: Negative for environmental allergies and polydipsia.  Psychiatric/Behavioral: Negative.  Negative for depression and substance abuse. The patient is not nervous/anxious and does not have insomnia.   All other systems reviewed and are negative.   Physical Exam: BP 110/72   Pulse 86   Temp 97.7 F (36.5 C)   Ht 5\' 3"  (1.6 m)   Wt 230 lb (104.3 kg)   SpO2 99%   BMI 40.74 kg/m  Wt Readings from Last 3 Encounters:  04/03/17 230 lb (104.3 kg)  12/25/16 218 lb 9.6 oz (99.2 kg)  11/21/16 214 lb 12.8 oz (97.4 kg)   General  Appearance: Well nourished, in no apparent distress. Eyes: PERRLA, EOMs, conjunctiva no swelling or erythema Sinuses: No Frontal/maxillary tenderness ENT/Mouth: Ext aud canals clear, TMs without erythema, bulging. No erythema, swelling, or exudate on post pharynx.  Tonsils not swollen or erythematous. Hearing normal.  Neck: Supple, thyroid normal.  Respiratory: Respiratory effort normal, BS equal bilaterally without rales, rhonchi, wheezing or stridor.  Cardio: RRR with no MRGs. Brisk peripheral pulses without edema.  Abdomen: Soft, + BS.  Non tender, no guarding, rebound, hernias, masses. Lymphatics: Non tender without lymphadenopathy.  Musculoskeletal: Full ROM, 5/5 strength, Normal gait Skin: Warm, dry without rashes, ecchymosis. Lump to left breast at 7 O'clock approx 6 cm from nipple, superficial, firm round area just beneath skin without injection/heat, appears to have superficial opening but without drainage.  Neuro: Cranial nerves intact. No cerebellar symptoms.  Psych: Awake and oriented X 3, normal affect, Insight and Judgment appropriate.    Dan MakerAshley C Adele Milson, NP 5:24 PM [facility]

## 2017-04-03 ENCOUNTER — Encounter: Payer: Self-pay | Admitting: Adult Health

## 2017-04-03 ENCOUNTER — Ambulatory Visit: Payer: BC Managed Care – PPO | Admitting: Adult Health

## 2017-04-03 VITALS — BP 110/72 | HR 86 | Temp 97.7°F | Ht 63.0 in | Wt 230.0 lb

## 2017-04-03 DIAGNOSIS — R7989 Other specified abnormal findings of blood chemistry: Secondary | ICD-10-CM | POA: Diagnosis not present

## 2017-04-03 DIAGNOSIS — I10 Essential (primary) hypertension: Secondary | ICD-10-CM | POA: Insufficient documentation

## 2017-04-03 DIAGNOSIS — Z3009 Encounter for other general counseling and advice on contraception: Secondary | ICD-10-CM | POA: Diagnosis not present

## 2017-04-03 DIAGNOSIS — Z6841 Body Mass Index (BMI) 40.0 and over, adult: Secondary | ICD-10-CM

## 2017-04-03 DIAGNOSIS — L02229 Furuncle of trunk, unspecified: Secondary | ICD-10-CM | POA: Diagnosis not present

## 2017-04-03 MED ORDER — DOXYCYCLINE HYCLATE 100 MG PO CAPS: ORAL_CAPSULE | ORAL | 0 refills | Status: DC

## 2017-04-03 MED ORDER — ETONOGESTREL-ETHINYL ESTRADIOL 0.12-0.015 MG/24HR VA RING: VAGINAL_RING | VAGINAL | 12 refills | Status: DC

## 2017-04-03 MED ORDER — PHENTERMINE HCL 37.5 MG PO TABS: 37.5000 mg | ORAL_TABLET | Freq: Every day | ORAL | 2 refills | Status: DC

## 2017-04-03 MED ORDER — TRIAMTERENE-HCTZ 75-50 MG PO TABS: 1.0000 | ORAL_TABLET | Freq: Every day | ORAL | 1 refills | Status: DC

## 2017-04-03 NOTE — Patient Instructions (Addendum)
Ethinyl Estradiol; Etonogestrel vaginal ring What is this medicine? ETHINYL ESTRADIOL; ETONOGESTREL (ETH in il es tra DYE ole; et oh noe JES trel) vaginal ring is a flexible, vaginal ring used as a contraceptive (birth control method). This medicine combines two types of female hormones, an estrogen and a progestin. This ring is used to prevent ovulation and pregnancy. Each ring is effective for one month. This medicine may be used for other purposes; ask your health care provider or pharmacist if you have questions. COMMON BRAND NAME(S): NuvaRing What should I tell my health care provider before I take this medicine? They need to know if you have or ever had any of these conditions: -abnormal vaginal bleeding -blood vessel disease or blood clots -breast, cervical, endometrial, ovarian, liver, or uterine cancer -diabetes -gallbladder disease -heart disease or recent heart attack -high blood pressure -high cholesterol -kidney disease -liver disease -migraine headaches -stroke -systemic lupus erythematosus (SLE) -tobacco smoker -an unusual or allergic reaction to estrogens, progestins, other medicines, foods, dyes, or preservatives -pregnant or trying to get pregnant -breast-feeding How should I use this medicine? Insert the ring into your vagina as directed. Follow the directions on the prescription label. The ring will remain place for 3 weeks and is then removed for a 1-week break. A new ring is inserted 1 week after the last ring was removed, on the same day of the week. Check often to make sure the ring is still in place, especially before and after sexual intercourse. If the ring was out of the vagina for an unknown amount of time, you may not be protected from pregnancy. Perform a pregnancy test and call your doctor. Do not use more often than directed. A patient package insert for the product will be given with each prescription and refill. Read this sheet carefully each time. The  sheet may change frequently. Contact your pediatrician regarding the use of this medicine in children. Special care may be needed. This medicine has been used in female children who have started having menstrual periods. Overdosage: If you think you have taken too much of this medicine contact a poison control center or emergency room at once. NOTE: This medicine is only for you. Do not share this medicine with others. What if I miss a dose? You will need to replace your vaginal ring once a month as directed. If the ring should slip out, or if you leave it in longer or shorter than you should, contact your health care professional for advice. What may interact with this medicine? Do not take this medicine with the following medication: -dasabuvir; ombitasvir; paritaprevir; ritonavir -ombitasvir; paritaprevir; ritonavir This medicine may also interact with the following medications: -acetaminophen -antibiotics or medicines for infections, especially rifampin, rifabutin, rifapentine, and griseofulvin, and possibly penicillins or tetracyclines -aprepitant -ascorbic acid (vitamin C) -atorvastatin -barbiturate medicines, such as phenobarbital -bosentan -carbamazepine -caffeine -clofibrate -cyclosporine -dantrolene -doxercalciferol -felbamate -grapefruit juice -hydrocortisone -medicines for anxiety or sleeping problems, such as diazepam or temazepam -medicines for diabetes, including pioglitazone -modafinil -mycophenolate -nefazodone -oxcarbazepine -phenytoin -prednisolone -ritonavir or other medicines for HIV infection or AIDS -rosuvastatin -selegiline -soy isoflavones supplements -St. John's wort -tamoxifen or raloxifene -theophylline -thyroid hormones -topiramate -warfarin This list may not describe all possible interactions. Give your health care provider a list of all the medicines, herbs, non-prescription drugs, or dietary supplements you use. Also tell them if you smoke,  drink alcohol, or use illegal drugs. Some items may interact with your medicine. What should I watch for while using   this medicine? Visit your doctor or health care professional for regular checks on your progress. You will need a regular breast and pelvic exam and Pap smear while on this medicine. Use an additional method of contraception during the first cycle that you use this ring. Do not use a diaphragm or female condom, as the ring can interfere with these birth control methods and their proper placement. If you have any reason to think you are pregnant, stop using this medicine right away and contact your doctor or health care professional. If you are using this medicine for hormone related problems, it may take several cycles of use to see improvement in your condition. Smoking increases the risk of getting a blood clot or having a stroke while you are using hormonal birth control, especially if you are more than 35 years old. You are strongly advised not to smoke. This medicine can make your body retain fluid, making your fingers, hands, or ankles swell. Your blood pressure can go up. Contact your doctor or health care professional if you feel you are retaining fluid. This medicine can make you more sensitive to the sun. Keep out of the sun. If you cannot avoid being in the sun, wear protective clothing and use sunscreen. Do not use sun lamps or tanning beds/booths. If you wear contact lenses and notice visual changes, or if the lenses begin to feel uncomfortable, consult your eye care specialist. In some women, tenderness, swelling, or minor bleeding of the gums may occur. Notify your dentist if this happens. Brushing and flossing your teeth regularly may help limit this. See your dentist regularly and inform your dentist of the medicines you are taking. If you are going to have elective surgery, you may need to stop using this medicine before the surgery. Consult your health care professional  for advice. This medicine does not protect you against HIV infection (AIDS) or any other sexually transmitted diseases. What side effects may I notice from receiving this medicine? Side effects that you should report to your doctor or health care professional as soon as possible: -breast tissue changes or discharge -changes in vaginal bleeding during your period or between your periods -chest pain -coughing up blood -dizziness or fainting spells -headaches or migraines -leg, arm or groin pain -severe or sudden headaches -stomach pain (severe) -sudden shortness of breath -sudden loss of coordination, especially on one side of the body -speech problems -symptoms of vaginal infection like itching, irritation or unusual discharge -tenderness in the upper abdomen -vomiting -weakness or numbness in the arms or legs, especially on one side of the body -yellowing of the eyes or skin Side effects that usually do not require medical attention (report to your doctor or health care professional if they continue or are bothersome): -breakthrough bleeding and spotting that continues beyond the 3 initial cycles of pills -breast tenderness -mood changes, anxiety, depression, frustration, anger, or emotional outbursts -increased sensitivity to sun or ultraviolet light -nausea -skin rash, acne, or brown spots on the skin -weight gain (slight) This list may not describe all possible side effects. Call your doctor for medical advice about side effects. You may report side effects to FDA at 1-800-FDA-1088. Where should I keep my medicine? Keep out of the reach of children. Store at room temperature between 15 and 30 degrees C (59 and 86 degrees F) for up to 4 months. The product will expire after 4 months. Protect from light. Throw away any unused medicine after the expiration date. NOTE: This   sheet is a summary. It may not cover all possible information. If you have questions about this medicine, talk  to your doctor, pharmacist, or health care provider.  2018 Elsevier/Gold Standard (2015-09-29 17:00:31)    Common causes of cough OR hoarseness OR sore throat:   Allergies, Viral Infections, Acid Reflux and Bacterial Infections.   Allergies and viral infections cause a cough OR sore throat by post nasal drip and are often worse at night, can also have sneezing, lower grade fevers, clear/yellow mucus. This is best treated with allergy medications or nasal sprays.  Please get on allegra for 1-2 weeks The strongest is allegra or fexafinadine  Cheapest at [employer], [employer], [employer]   Bacterial infections are more severe than allergies or viral infections with fever, teeth pain, fatigue. This can be treated with prednisone and the same over the counter medication and after 7 days can be treated with an antibiotic.   Silent reflux/GERD can cause a cough OR sore throat OR hoarseness WITHOUT heart burn because the esophagus that goes to the stomach and trachea that goes to the lungs are very close and when you lay down the acid can irritate your throat and lungs. This can cause hoarseness, cough, and wheezing. Please stop any alcohol or anti-inflammatories like aleve/advil/ibuprofen and start an over the counter Prilosec or omeprazole 1-2 times daily 30mins before food for 2 weeks, then switch to over the counter zantac/ratinidine or pepcid/famotadine once at night for 2 weeks.    sometimes irritation causes more irritation. Try voice rest, use sugar free cough drops to prevent coughing, and try to stop clearing your throat.   If you ever have a cough that does not go away after trying these things please make a follow up visit for further evaluation or we can refer you to a specialist. Or if you ever have shortness of breath or chest pain go to the ER.

## 2017-05-26 ENCOUNTER — Other Ambulatory Visit: Payer: Self-pay | Admitting: Physician Assistant

## 2017-05-27 ENCOUNTER — Telehealth: Payer: Self-pay

## 2017-05-27 NOTE — Telephone Encounter (Signed)
Abnormal bleeding  2wks Spotting now a littler heavier w/ ab pain 2mth of B/C NUVARING asking for suggestions. Would like to know if this is normal?    Per provider  Take home preg. Test Take out nuvaring for 1 wk & then restart a new one. If not better may need to change to a new B/C.   Pt voiced understanding & hung up. April 23rd 2019.

## 2017-07-21 ENCOUNTER — Ambulatory Visit: Payer: Self-pay | Admitting: Adult Health

## 2017-11-13 ENCOUNTER — Other Ambulatory Visit: Payer: Self-pay | Admitting: Orthopedic Surgery

## 2017-11-13 MED ORDER — CEFAZOLIN SODIUM-DEXTROSE 2-4 GM/100ML-% IV SOLN
2.0000 g | INTRAVENOUS | Status: AC
  Administered 2017-11-14: 2 g via INTRAVENOUS

## 2017-11-14 ENCOUNTER — Ambulatory Visit: Payer: No Typology Code available for payment source

## 2017-11-14 ENCOUNTER — Ambulatory Visit: Payer: No Typology Code available for payment source | Admitting: Certified Registered Nurse Anesthetist

## 2017-11-14 ENCOUNTER — Encounter: Payer: Self-pay | Admitting: Emergency Medicine

## 2017-11-14 ENCOUNTER — Ambulatory Visit
Admission: RE | Admit: 2017-11-14 | Discharge: 2017-11-14 | Disposition: A | Discharge status: Home / Self Care | Payer: No Typology Code available for payment source | Source: Ambulatory Visit | Attending: Orthopedic Surgery | Admitting: Orthopedic Surgery

## 2017-11-14 ENCOUNTER — Other Ambulatory Visit: Payer: Self-pay

## 2017-11-14 ENCOUNTER — Encounter
Admission: RE | Disposition: A | Discharge status: Home / Self Care | Payer: Self-pay | Source: Ambulatory Visit | Attending: Orthopedic Surgery

## 2017-11-14 DIAGNOSIS — S82841A Displaced bimalleolar fracture of right lower leg, initial encounter for closed fracture: Secondary | ICD-10-CM | POA: Insufficient documentation

## 2017-11-14 DIAGNOSIS — Y9345 Activity, cheerleading: Secondary | ICD-10-CM | POA: Diagnosis not present

## 2017-11-14 DIAGNOSIS — W010XXA Fall on same level from slipping, tripping and stumbling without subsequent striking against object, initial encounter: Secondary | ICD-10-CM | POA: Diagnosis not present

## 2017-11-14 DIAGNOSIS — S82899A Other fracture of unspecified lower leg, initial encounter for closed fracture: Secondary | ICD-10-CM

## 2017-11-14 HISTORY — DX: Localized edema: R60.0

## 2017-11-14 HISTORY — PX: ORIF ANKLE FRACTURE: SHX5408

## 2017-11-14 LAB — BASIC METABOLIC PANEL
Anion gap: 10 (ref 5–15)
BUN: 8 mg/dL (ref 6–20)
CHLORIDE: 100 mmol/L (ref 98–111)
CO2: 27 mmol/L (ref 22–32)
CREATININE: 0.96 mg/dL (ref 0.44–1.00)
Calcium: 9.3 mg/dL (ref 8.9–10.3)
GFR calc Af Amer: >60 mL/min (ref >60)
GFR calc non Af Amer: >60 mL/min (ref >60)
Glucose, Bld: 104 mg/dL — ABNORMAL HIGH (ref 70–99)
Potassium: 3.6 mmol/L (ref 3.5–5.1)
Sodium: 137 mmol/L (ref 135–145)

## 2017-11-14 LAB — APTT: aPTT: 27 seconds (ref 24–36)

## 2017-11-14 LAB — POCT PREGNANCY, URINE: Preg Test, Ur: NEGATIVE

## 2017-11-14 LAB — CBC WITH DIFFERENTIAL/PLATELET
ABS IMMATURE GRANULOCYTES: 0.04 10*3/uL (ref 0.00–0.07)
BASOS ABS: 0 10*3/uL (ref 0.0–0.1)
Basophils Relative: 0 %
EOS PCT: 1 %
Eosinophils Absolute: 0.1 10*3/uL (ref 0.0–0.5)
HEMATOCRIT: 40.2 % (ref 36.0–46.0)
HEMOGLOBIN: 13.1 g/dL (ref 12.0–15.0)
Immature Granulocytes: 1 %
LYMPHS ABS: 3.1 10*3/uL (ref 0.7–4.0)
LYMPHS PCT: 37 %
MCH: 28.1 pg (ref 26.0–34.0)
MCHC: 32.6 g/dL (ref 30.0–36.0)
MCV: 86.1 fL (ref 80.0–100.0)
MONO ABS: 0.5 10*3/uL (ref 0.1–1.0)
Monocytes Relative: 6 %
NRBC: 0 % (ref 0.0–0.2)
Neutro Abs: 4.7 10*3/uL (ref 1.7–7.7)
Neutrophils Relative %: 55 %
Platelets: 441 10*3/uL — ABNORMAL HIGH (ref 150–400)
RBC: 4.67 MIL/uL (ref 3.87–5.11)
RDW: 13.5 % (ref 11.5–15.5)
WBC: 8.4 10*3/uL (ref 4.0–10.5)

## 2017-11-14 LAB — PROTIME-INR
INR: 0.89
Prothrombin Time: 12 seconds (ref 11.4–15.2)

## 2017-11-14 SURGERY — OPEN REDUCTION INTERNAL FIXATION (ORIF) ANKLE FRACTURE
Anesthesia: General | Site: Ankle | Laterality: Right

## 2017-11-14 MED ORDER — PROPOFOL 10 MG/ML IV BOLUS
INTRAVENOUS | Status: DC | PRN
  Administered 2017-11-14: 200 mg via INTRAVENOUS

## 2017-11-14 MED ORDER — SUGAMMADEX SODIUM 500 MG/5ML IV SOLN
INTRAVENOUS | Status: AC
  Filled 2017-11-14: qty 5

## 2017-11-14 MED ORDER — CHLORHEXIDINE GLUCONATE CLOTH 2 % EX PADS: 6.0000 | MEDICATED_PAD | Freq: Once | CUTANEOUS | Status: DC

## 2017-11-14 MED ORDER — LACTATED RINGERS IV SOLN
INTRAVENOUS | Status: DC
  Administered 2017-11-14: 12:00:00 via INTRAVENOUS

## 2017-11-14 MED ORDER — FENTANYL CITRATE (PF) 100 MCG/2ML IJ SOLN
25.0000 ug | INTRAMUSCULAR | Status: DC | PRN
  Administered 2017-11-14: 25 ug via INTRAVENOUS

## 2017-11-14 MED ORDER — DEXAMETHASONE SODIUM PHOSPHATE 10 MG/ML IJ SOLN
INTRAMUSCULAR | Status: AC
  Filled 2017-11-14: qty 1

## 2017-11-14 MED ORDER — PROMETHAZINE HCL 25 MG/ML IJ SOLN: 6.2500 mg | INTRAMUSCULAR | Status: DC | PRN

## 2017-11-14 MED ORDER — MIDAZOLAM HCL 2 MG/2ML IJ SOLN
INTRAMUSCULAR | Status: DC | PRN
  Administered 2017-11-14 (×2): 1 mg via INTRAVENOUS

## 2017-11-14 MED ORDER — CEFAZOLIN SODIUM-DEXTROSE 2-4 GM/100ML-% IV SOLN
INTRAVENOUS | Status: AC
  Filled 2017-11-14: qty 100

## 2017-11-14 MED ORDER — ONDANSETRON HCL 4 MG/2ML IJ SOLN
INTRAMUSCULAR | Status: AC
  Filled 2017-11-14: qty 2

## 2017-11-14 MED ORDER — SUGAMMADEX SODIUM 200 MG/2ML IV SOLN
INTRAVENOUS | Status: DC | PRN
  Administered 2017-11-14: 250 mg via INTRAVENOUS

## 2017-11-14 MED ORDER — LIDOCAINE HCL (CARDIAC) PF 100 MG/5ML IV SOSY
PREFILLED_SYRINGE | INTRAVENOUS | Status: DC | PRN
  Administered 2017-11-14: 100 mg via INTRAVENOUS

## 2017-11-14 MED ORDER — NEOMYCIN-POLYMYXIN B GU 40-200000 IR SOLN
Status: AC
  Filled 2017-11-14: qty 4

## 2017-11-14 MED ORDER — ONDANSETRON HCL 4 MG PO TABS: 4.0000 mg | ORAL_TABLET | Freq: Three times a day (TID) | ORAL | 0 refills | Status: DC | PRN

## 2017-11-14 MED ORDER — ROPIVACAINE HCL 5 MG/ML IJ SOLN
INTRAMUSCULAR | Status: AC
  Filled 2017-11-14: qty 30

## 2017-11-14 MED ORDER — LIDOCAINE HCL (PF) 2 % IJ SOLN
INTRAMUSCULAR | Status: AC
  Filled 2017-11-14: qty 10

## 2017-11-14 MED ORDER — ONDANSETRON HCL 4 MG/2ML IJ SOLN
INTRAMUSCULAR | Status: DC | PRN
  Administered 2017-11-14: 4 mg via INTRAVENOUS

## 2017-11-14 MED ORDER — HYDROMORPHONE HCL 1 MG/ML IJ SOLN
INTRAMUSCULAR | Status: DC | PRN
  Administered 2017-11-14: 0.5 mg via INTRAVENOUS

## 2017-11-14 MED ORDER — BUPIVACAINE HCL 0.25 % IJ SOLN
INTRAMUSCULAR | Status: DC | PRN
  Administered 2017-11-14: 30 mL

## 2017-11-14 MED ORDER — ROCURONIUM BROMIDE 100 MG/10ML IV SOLN
INTRAVENOUS | Status: DC | PRN
  Administered 2017-11-14: 5 mg via INTRAVENOUS
  Administered 2017-11-14: 25 mg via INTRAVENOUS

## 2017-11-14 MED ORDER — LIDOCAINE HCL (PF) 1 % IJ SOLN
INTRAMUSCULAR | Status: AC
  Filled 2017-11-14: qty 5

## 2017-11-14 MED ORDER — BUPIVACAINE HCL (PF) 0.25 % IJ SOLN
INTRAMUSCULAR | Status: AC
  Filled 2017-11-14: qty 30

## 2017-11-14 MED ORDER — PROPOFOL 10 MG/ML IV BOLUS
INTRAVENOUS | Status: AC
  Filled 2017-11-14: qty 20

## 2017-11-14 MED ORDER — HYDROCODONE-ACETAMINOPHEN 5-325 MG PO TABS: 1.0000 | ORAL_TABLET | ORAL | 0 refills | Status: DC | PRN

## 2017-11-14 MED ORDER — ACETAMINOPHEN 10 MG/ML IV SOLN
INTRAVENOUS | Status: DC | PRN
  Administered 2017-11-14: 1000 mg via INTRAVENOUS

## 2017-11-14 MED ORDER — LIDOCAINE HCL (PF) 1 % IJ SOLN
INTRAMUSCULAR | Status: DC | PRN
  Administered 2017-11-14: 5 mL via SUBCUTANEOUS

## 2017-11-14 MED ORDER — MIDAZOLAM HCL 2 MG/2ML IJ SOLN
INTRAMUSCULAR | Status: AC
  Filled 2017-11-14: qty 2

## 2017-11-14 MED ORDER — HYDROMORPHONE HCL 1 MG/ML IJ SOLN
INTRAMUSCULAR | Status: AC
  Filled 2017-11-14: qty 1

## 2017-11-14 MED ORDER — FENTANYL CITRATE (PF) 100 MCG/2ML IJ SOLN
INTRAMUSCULAR | Status: AC
  Filled 2017-11-14: qty 2

## 2017-11-14 MED ORDER — SUCCINYLCHOLINE CHLORIDE 20 MG/ML IJ SOLN
INTRAMUSCULAR | Status: DC | PRN
  Administered 2017-11-14: 100 mg via INTRAVENOUS

## 2017-11-14 MED ORDER — DEXAMETHASONE SODIUM PHOSPHATE 10 MG/ML IJ SOLN
INTRAMUSCULAR | Status: DC | PRN
  Administered 2017-11-14: 10 mg via INTRAVENOUS

## 2017-11-14 MED ORDER — ACETAMINOPHEN 10 MG/ML IV SOLN
INTRAVENOUS | Status: AC
  Filled 2017-11-14: qty 100

## 2017-11-14 MED ORDER — ROPIVACAINE HCL 5 MG/ML IJ SOLN
INTRAMUSCULAR | Status: DC | PRN
  Administered 2017-11-14: 30 mL via PERINEURAL

## 2017-11-14 MED ORDER — ASPIRIN EC 325 MG PO TBEC: 325.0000 mg | DELAYED_RELEASE_TABLET | Freq: Every day | ORAL | 0 refills | Status: DC

## 2017-11-14 MED ORDER — NEOMYCIN-POLYMYXIN B GU 40-200000 IR SOLN
Status: DC | PRN
  Administered 2017-11-14: 4 mL

## 2017-11-14 MED ORDER — FENTANYL CITRATE (PF) 100 MCG/2ML IJ SOLN
INTRAMUSCULAR | Status: AC
  Administered 2017-11-14: 25 ug via INTRAVENOUS
  Filled 2017-11-14: qty 2

## 2017-11-14 MED ORDER — FENTANYL CITRATE (PF) 100 MCG/2ML IJ SOLN
INTRAMUSCULAR | Status: DC | PRN
  Administered 2017-11-14 (×2): 50 ug via INTRAVENOUS

## 2017-11-14 MED ORDER — PHENYLEPHRINE HCL 10 MG/ML IJ SOLN
INTRAMUSCULAR | Status: DC | PRN
  Administered 2017-11-14: 200 ug via INTRAVENOUS

## 2017-11-14 SURGICAL SUPPLY — 54 items
BANDAGE ELASTIC 4 LF NS (GAUZE/BANDAGES/DRESSINGS) ×4 IMPLANT
BIT DRILL 2.5X110 QC LCP DISP (BIT) ×1 IMPLANT
BIT DRILL CANN 2.7X625 NONSTRL (BIT) ×1 IMPLANT
BLADE SURG 15 STRL LF DISP TIS (BLADE) ×1 IMPLANT
BLADE SURG 15 STRL SS (BLADE) ×2
BNDG CMPR MED 5X4 ELC HKLP NS (GAUZE/BANDAGES/DRESSINGS) ×2
BNDG ESMARK 6X12 TAN STRL LF (GAUZE/BANDAGES/DRESSINGS) ×2 IMPLANT
COVER WAND RF STERILE (DRAPES) ×2 IMPLANT
CUFF TOURN 24 STER (MISCELLANEOUS) IMPLANT
CUFF TOURN 30 STER DUAL PORT (MISCELLANEOUS) ×1 IMPLANT
DRAPE FLUOR MINI C-ARM 54X84 (DRAPES) ×2 IMPLANT
DRAPE INCISE IOBAN 66X45 STRL (DRAPES) ×2 IMPLANT
DRAPE U-SHAPE 47X51 STRL (DRAPES) ×2 IMPLANT
DURAPREP 26ML APPLICATOR (WOUND CARE) ×4 IMPLANT
ELECT REM PT RETURN 9FT ADLT (ELECTROSURGICAL) ×2
ELECTRODE REM PT RTRN 9FT ADLT (ELECTROSURGICAL) ×1 IMPLANT
GAUZE PETRO XEROFOAM 1X8 (MISCELLANEOUS) ×2 IMPLANT
GAUZE SPONGE 4X4 12PLY STRL (GAUZE/BANDAGES/DRESSINGS) ×2 IMPLANT
GLOVE BIOGEL PI IND STRL 9 (GLOVE) ×1 IMPLANT
GLOVE BIOGEL PI INDICATOR 9 (GLOVE) ×1
GLOVE SURG 9.0 ORTHO LTXF (GLOVE) ×4 IMPLANT
GOWN STRL REUS TWL 2XL XL LVL4 (GOWN DISPOSABLE) ×2 IMPLANT
GOWN STRL REUS W/ TWL LRG LVL3 (GOWN DISPOSABLE) ×1 IMPLANT
GOWN STRL REUS W/TWL LRG LVL3 (GOWN DISPOSABLE) ×2
GUIDEWIRE THREADED 150MM (WIRE) ×2 IMPLANT
KIT TURNOVER KIT A (KITS) ×2 IMPLANT
LABEL OR SOLS (LABEL) ×2 IMPLANT
NS IRRIG 1000ML POUR BTL (IV SOLUTION) ×2 IMPLANT
PACK EXTREMITY ARMC (MISCELLANEOUS) ×2 IMPLANT
PAD ABD DERMACEA PRESS 5X9 (GAUZE/BANDAGES/DRESSINGS) ×4 IMPLANT
PAD CAST CTTN 4X4 STRL (SOFTGOODS) ×3 IMPLANT
PADDING CAST COTTON 4X4 STRL (SOFTGOODS) ×6
PLATE LCP 3.5 1/3 TUB 7HX81 (Plate) ×1 IMPLANT
SCREW CANC FT 4.0X20 (Screw) ×1 IMPLANT
SCREW CANC FT/18 4.0 (Screw) ×2 IMPLANT
SCREW CORTEX 3.5 12MM (Screw) ×2 IMPLANT
SCREW CORTEX 3.5 14MM (Screw) ×2 IMPLANT
SCREW CORTEX 3.5 16MM (Screw) ×1 IMPLANT
SCREW CORTEX 3.5 18MM (Screw) ×1 IMPLANT
SCREW LOCK CORT ST 3.5X12 (Screw) IMPLANT
SCREW LOCK CORT ST 3.5X14 (Screw) IMPLANT
SCREW LOCK CORT ST 3.5X16 (Screw) IMPLANT
SCREW LOCK CORT ST 3.5X18 (Screw) IMPLANT
SCREW SHORT THREAD 4.0X40 (Screw) ×1 IMPLANT
SCREW SHORT THREAD 4.0X42 (Screw) ×1 IMPLANT
SPLINT CAST 1 STEP 4X30 (MISCELLANEOUS) ×4 IMPLANT
SPONGE LAP 18X18 RF (DISPOSABLE) ×2 IMPLANT
STAPLER SKIN PROX 35W (STAPLE) ×2 IMPLANT
STOCKINETTE STRL 6IN 960660 (GAUZE/BANDAGES/DRESSINGS) ×2 IMPLANT
STRIP CLOSURE SKIN 1/2X4 (GAUZE/BANDAGES/DRESSINGS) ×2 IMPLANT
SUT VIC AB 2-0 SH 27 (SUTURE) ×4
SUT VIC AB 2-0 SH 27XBRD (SUTURE) ×2 IMPLANT
SYR 30ML LL (SYRINGE) ×2 IMPLANT
TAPE MICROPORE 2IN (TAPE) ×2 IMPLANT

## 2017-11-14 NOTE — Progress Notes (Signed)
POST-OP CHECK:  In PACU patient has intact sensation to light touch in all toes.  She has intact motor function.  Patient did not have significant swelling in her right ankle during surgery and her incisions closed without thension.  Patient having moderate right ankle pain in PACU.   She wants to go home.  She has elected to receive a popliteal block by anesthesia to help her pain post-op and allow her to go home.

## 2017-11-14 NOTE — Anesthesia Preprocedure Evaluation (Addendum)
Anesthesia Evaluation  Patient identified by MRN, date of birth, ID band Patient awake    Reviewed: Allergy & Precautions, H&P , NPO status , Patient's Chart, lab work & pertinent test results, reviewed documented beta blocker date and time   History of Anesthesia Complications Negative for: history of anesthetic complications  Airway Mallampati: I  TM Distance: >3 FB Neck ROM: full    Dental  (+) Dental Advidsory Given   Pulmonary neg pulmonary ROS, former smoker,           Cardiovascular Exercise Tolerance: Good negative cardio ROS       Neuro/Psych negative neurological ROS  negative psych ROS   GI/Hepatic negative GI ROS, Neg liver ROS,   Endo/Other  neg diabetesMorbid obesity  Renal/GU negative Renal ROS  negative genitourinary   Musculoskeletal   Abdominal   Peds  Hematology negative hematology ROS (+)   Anesthesia Other Findings Past Medical History: No date: Allergy     Comment:  seasonal No date: Fluid retention in legs     Comment:  takes maxide prn  Consented for post-op nerve block if she would like it.  Reproductive/Obstetrics negative OB ROS                            Anesthesia Physical Anesthesia Plan  ASA: III  Anesthesia Plan: General   Post-op Pain Management:  Regional for Post-op pain   Induction: Intravenous  PONV Risk Score and Plan: 3 and Ondansetron, Dexamethasone, Midazolam, Promethazine and Treatment may vary due to age or medical condition  Airway Management Planned: Oral ETT  Additional Equipment:   Intra-op Plan:   Post-operative Plan: Extubation in OR  Informed Consent: I have reviewed the patients History and Physical, chart, labs and discussed the procedure including the risks, benefits and alternatives for the proposed anesthesia with the patient or authorized representative who has indicated his/her understanding and acceptance.    Dental Advisory Given  Plan Discussed with: Anesthesiologist, CRNA and Surgeon  Anesthesia Plan Comments:        Anesthesia Quick Evaluation

## 2017-11-14 NOTE — Op Note (Signed)
11/14/2017  4:42 PM  PATIENT:  Adriana Carr    PRE-OPERATIVE DIAGNOSIS:  Closed right bimalleolar ankle fracture  POST-OPERATIVE DIAGNOSIS:  Same  PROCEDURE:  OPEN REDUCTION INTERNAL FIXATION (ORIF) RIGHT ANKLE FRACTURE  SURGEON:  Thornton Park, MD  ANESTHESIA:   General  PREOPERATIVE INDICATIONS:  Adriana Carr is a  32 y.o. female with a diagnosis of right ankle fracture who failed conservative measures and elected for surgical management.    I discussed the risks and benefits of surgery. The risks include but are not limited to infection, bleeding requiring blood transfusion, nerve or blood vessel injury, joint stiffness or loss of motion, persistent pain, weakness or instability, malunion, nonunion and hardware failure and the need for further surgery. Medical risks include but are not limited to DVT and pulmonary embolism, myocardial infarction, stroke, pneumonia, respiratory failure and death. Patient understood these risks and wished to proceed.   OPERATIVE IMPLANTS: Synthes 7 hole 1/3 tubular plate for lateral fixation with 4.31m cannulated screws x 2 for medial fixation.  OPERATIVE FINDINGS: unstable bimalleolar ankle fracture without syndesmotic disruption  OPERATIVE PROCEDURE:   Patient was met in the preoperative area. The right leg was signed my initials and the word yes according the hospital's correct site of surgery protocol. The patient had a pre-op H&P performed in the pre-op area.  She was brought to the operating room where she underwent general anesthesia. The patient was placed supine on the operative table.  A tourniquet was applied to the right thigh.  The lower extremity was prepped and draped in a sterile fashion. A timeout was performed to verify the patient's name, date of birth, medical record number, correct site of surgery and correct procedure to be performed. It was also used to verify the patient received antibiotics, and that all appropriate instruments,  implants and radiographic studies were available in the room. Once all in attendance were in agreement, the case began.  The right lower extremity was exsanguinated with an Esmarch. The tourniquet was inflated to 275 mmHg. This was applied for a total of 101 minutes. A lateral incision was made over the fibula. The subcutaneous tissues were dissected with the Metzenbaum scissor and pickup. Care was taken to avoid injury to the superficial peroneal nerve. The lateral malleolus fracture was identified and irrigated and fracture hematoma was removed. Soft tissue was removed from the fracture site using a periosteal elevator. A fracture reduction clamp was then used to reduce the fracture to an anatomic position.     The lateral malleolus was then drilled in an AP direction, perpendicular to the fracture site to allow for placement of the lag screw.   A single lag screw, 18 mm in length, was advanced across the fracture site by hand. This compressed the fracture.   A 7 hole, 1/3 tubular plate was then contoured and placed along the lateral fibula. Bicortical screws were placed proximal to the fracture and fully threaded cancellus screws were placed distal the fracture. The fracture reduction and hardware placement were confirmed on AP and lateral imaging.  Once the lateral malleolus was plated, the attention was turned to the medial ankle. A small vertical incision was made over the tip of the medial malleolus.  Soft tissue was dissected with some with the Metzenbaum scissor and pickup. The fracture of the medial malleolus was identified. This was reduced with a dental pick. 2 threaded K wires for the 4.0 cannulated screws were then advanced through the tip of the medial  malleolus across the fracture site and into the distal tibia. The position of the K wires was evaluated on AP and lateral FluoroScan images. The length of the wires were measured with a depth gauge. The wires were then overdrilled with a  cannulated drill for the 4.0 cannulated screws. The short threaded 4.0 cannulated screws were then advanced into position by hand, compressing the medial malleolus fracture.    A stress test of the right ankle was then performed under fluoroscopy.  This test did not reveal any syndesmotic injury or opening of the medial clear space.  The medial and lateral incisions were then copiously irrigated. The subcutaneous tissue was closed with 2-0 Vicryl and the skin approximated staples. A dry sterile dressing was applied along with an AO splint. The patient's ankle was positioned in neutral. The pateint was then awoken from anesthesia, transferred to hospital bed and brought to the PACU in stable condition. I was scrubbed and present the entire case and all sharp and instrument counts were correct at conclusion the case. I spoke to the patient's family in the post-op consultation room to let them know the case was performed without complication and the patient was stable in recovery room.     Timoteo Gaul, MD

## 2017-11-14 NOTE — Transfer of Care (Signed)
Immediate Anesthesia Transfer of Care Note  Patient: Adriana Carr  Procedure(s) Performed: OPEN REDUCTION INTERNAL FIXATION (ORIF) ANKLE FRACTURE (Right Ankle)  Patient Location: PACU  Anesthesia Type:General  Level of Consciousness: drowsy  Airway & Oxygen Therapy: Patient Spontanous Breathing and Patient connected to face mask oxygen  Post-op Assessment: Report given to RN and Post -op Vital signs reviewed and stable  Post vital signs: Reviewed and stable  Last Vitals:  Vitals Value Taken Time  BP 102/59 11/14/2017  3:31 PM  Temp 36.5 C 11/14/2017  3:30 PM  Pulse 81 11/14/2017  3:35 PM  Resp 25 11/14/2017  3:35 PM  SpO2 100 % 11/14/2017  3:35 PM  Vitals shown include unvalidated device data.  Last Pain:  Vitals:   11/14/17 1153  TempSrc: Oral  PainSc: 6       Patients Stated Pain Goal: 1 (11/14/17 1153)  Complications: No apparent anesthesia complications

## 2017-11-14 NOTE — Anesthesia Post-op Follow-up Note (Signed)
Anesthesia QCDR form completed.        

## 2017-11-14 NOTE — Anesthesia Procedure Notes (Signed)
Procedure Name: Intubation Date/Time: 11/14/2017 1:14 PM Performed by: Ginger Carne, CRNA Pre-anesthesia Checklist: Patient identified, Emergency Drugs available, Suction available, Patient being monitored and Timeout performed Patient Re-evaluated:Patient Re-evaluated prior to induction Oxygen Delivery Method: Circle system utilized Preoxygenation: Pre-oxygenation with 100% oxygen Induction Type: IV induction Ventilation: Mask ventilation without difficulty and Oral airway inserted - appropriate to patient size Laryngoscope Size: Hyacinth Meeker and 2 Grade View: Grade I Tube type: Oral Tube size: 7.0 mm Number of attempts: 1 Airway Equipment and Method: Stylet Placement Confirmation: ETT inserted through vocal cords under direct vision,  positive ETCO2 and breath sounds checked- equal and bilateral Secured at: 24 cm Tube secured with: Tape Dental Injury: Teeth and Oropharynx as per pre-operative assessment

## 2017-11-14 NOTE — H&P (Signed)
PREOPERATIVE H&P  Chief Complaint: Bimalleolar ankle fracture  HPI: Adriana Carr is a 32 y.o. female who presents for preoperative history and physical with a diagnosis of displaced bimalleollar right ankle fracture after slipping at cheerleading practice. 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  Patient was initially seen two days ago in my office where I closed reduced and splinted the fracture.  Given the displacement and instability of the right ankle I have recommended open reduction and internal fixation.    Past Medical History:  Diagnosis Date  . Allergy    seasonal  . Fluid retention in legs    takes maxide prn   Past Surgical History:  Procedure Laterality Date  . BREAST BIOPSY Left 2010   Keloidal growth per patient  . CYST REMOVAL TRUNK  lower back  . PILONIDAL CYST EXCISION  2003   Social History   Socioeconomic History  . Marital status: Single    Spouse name: Not on file  . Number of children: Not on file  . Years of education: Not on file  . Highest education level: Not on file  Occupational History  . Occupation: Runner, broadcasting/film/video  Social Needs  . Financial resource strain: Not on file  . Food insecurity:    Worry: Not on file    Inability: Not on file  . Transportation needs:    Medical: Not on file    Non-medical: Not on file  Tobacco Use  . Smoking status: Former Smoker    Packs/day: 0.50    Types: Cigarettes    Last attempt to quit: 11/30/2002    Years since quitting: 14.9  . Smokeless tobacco: Never Used  Substance and Sexual Activity  . Alcohol use: Yes    Comment: rarely  . Drug use: No  . Sexual activity: Not Currently    Birth control/protection: Inserts  Lifestyle  . Physical activity:    Days per week: Not on file    Minutes per session: Not on file  . Stress: Not on file  Relationships  . Social connections:    Talks on phone: Not on file    Gets together: Not on file    Attends religious service: Not on  file    Active member of club or organization: Not on file    Attends meetings of clubs or organizations: Not on file    Relationship status: Not on file  Other Topics Concern  . Not on file  Social History Narrative  . Not on file   Family History  Adopted: Yes  Family history unknown: Yes   No Known Allergies Prior to Admission medications   Medication Sig Start Date End Date Taking? Authorizing Provider  Cholecalciferol (VITAMIN D3) 10000 units TABS Take 1 tablet by mouth daily.   Yes [provider]  HYDROcodone-acetaminophen (NORCO/VICODIN) 5-325 MG tablet Take 1 tablet by mouth every 4 (four) hours as needed for moderate pain.   Yes [provider]  ibuprofen (ADVIL,MOTRIN) 400 MG tablet Take 400 mg by mouth every 6 (six) hours as needed for moderate pain.   Yes [provider]  oxycodone (OXY-IR) 5 MG capsule Take 5 mg by mouth every 4 (four) hours as needed for pain.   Yes [provider]  triamterene-hydrochlorothiazide (MAXZIDE) 75-50 MG tablet Take 1 tablet by mouth daily. Patient taking differently: Take 1 tablet by mouth daily as needed (fluid).  04/03/17 04/03/18 Yes [name], NP  Positive ROS: All other systems have been reviewed and were otherwise negative with the exception of those mentioned in the HPI and as above.  Physical Exam: General: Alert, no acute distress Cardiovascular: Regular rate and rhythm, no murmurs rubs or gallops.  No pedal edema Respiratory: Clear to auscultation bilaterally, no wheezes rales or rhonchi. No cyanosis, no use of accessory musculature GI: No organomegaly, abdomen is soft and non-tender nondistended with positive bowel sounds. Skin: Skin intact, no lesions within the operative field. Neurologic: Sensation intact distally Psychiatric: Patient is competent for consent with normal mood and affect Lymphatic: No cervical lymphadenopathy  MUSCULOSKELETAL: Right ankle:  Skin intact.   NVI.   Unstable right ankle.  Compartments soft.    Assessment: Bimalleolar right ankle fracture  Plan: Plan for Procedure(s): OPEN REDUCTION INTERNAL FIXATION (ORIF) RIGHT ANKLE FRACTURE  I have explained the details of the operation as well as the post-op course with the patient and her family.  I discussed the risks and benefits of surgery. The risks include but are not limited to infection, bleeding, nerve or blood vessel injury, joint stiffness or loss of motion, persistent pain, weakness or instability, malunion, nonunion and hardware failure and the need for further surgery. Medical risks include but are not limited to DVT and pulmonary embolism, myocardial infarction, stroke, pneumonia, respiratory failure and death. Patient understood these risks and wished to proceed.    Juanell Fairly, MD   11/14/2017 12:41 PM

## 2017-11-14 NOTE — Progress Notes (Signed)
Pt received a block at this time and tolerated procedure well

## 2017-11-14 NOTE — Anesthesia Procedure Notes (Signed)
Anesthesia Regional Block: Popliteal block   Pre-Anesthetic Checklist: ,, timeout performed, Correct Patient, Correct Site, Correct Laterality, Correct Procedure, Correct Position, site marked, Risks and benefits discussed,  Surgical consent,  Pre-op evaluation,  At surgeon's request and post-op pain management  Laterality: Lower and Right  Prep: chloraprep       Needles:  Injection technique: Single-shot  Needle Type: Echogenic Needle     Needle Length: 9cm  Needle Gauge: 21     Additional Needles:   Procedures:,,,, ultrasound used (permanent image in chart),,,,  Narrative:  Start time: 11/14/2017 4:07 PM End time: 11/14/2017 4:12 PM Injection made incrementally with aspirations every 5 mL.  Performed by: Personally  Anesthesiologist: Lenard Simmer, MD  Additional Notes: Functioning IV was confirmed and monitors were applied.  A echogenic needle was used. Sterile prep,hand hygiene and sterile gloves were used. Minimal sedation used for procedure.   No paresthesia endorsed by patient during the procedure.  Negative aspiration and negative test dose prior to incremental administration of local anesthetic. The patient tolerated the procedure well with no immediate complications.

## 2017-11-14 NOTE — Discharge Instructions (Signed)

## 2017-11-16 ENCOUNTER — Encounter: Payer: Self-pay | Admitting: Orthopedic Surgery

## 2017-11-17 NOTE — Anesthesia Postprocedure Evaluation (Signed)
Anesthesia Post Note  Patient: Adriana Carr  Procedure(s) Performed: OPEN REDUCTION INTERNAL FIXATION (ORIF) ANKLE FRACTURE (Right Ankle)  Patient location during evaluation: PACU Anesthesia Type: General Level of consciousness: awake and alert Pain management: pain level controlled Vital Signs Assessment: post-procedure vital signs reviewed and stable Respiratory status: spontaneous breathing, nonlabored ventilation, respiratory function stable and patient connected to nasal cannula oxygen Cardiovascular status: blood pressure returned to baseline and stable Postop Assessment: no apparent nausea or vomiting Anesthetic complications: no     Last Vitals:  Vitals:   11/14/17 1655 11/14/17 1743  BP: 120/66 122/67  Pulse: 88 92  Resp: 18 16  Temp: 36.7 C   SpO2: 100% 98%    Last Pain:  Vitals:   11/17/17 0806  TempSrc:   PainSc: 8                  Lenard Simmer

## 2017-12-24 ENCOUNTER — Encounter: Payer: Self-pay | Admitting: Adult Health

## 2018-02-20 NOTE — Progress Notes (Signed)
Complete Physical  Assessment and Plan: Health Maintenance- Discussed STD testing, safe sex, alcohol and drug awareness, drinking and driving dangers, wearing a seat belt and general safety measures for young adult.  Diagnoses and all orders for this visit:  Encounter for routine adult health examination without abnormal findings - referred back to GYN per patient request  Essential hypertension Monitor blood pressure at home; call if consistently over 130/80 Continue DASH diet.   Reminder to go to the ER if any CP, SOB, nausea, dizziness, severe HA, changes vision/speech, left arm numbness and tingling and jaw pain. -     CBC with Differential/Platelet -     COMPLETE METABOLIC PANEL WITH GFR -     Magnesium -     Urinalysis, Routine w reflex microscopic -     EKG 12-Lead  Eczema, unspecified type      -      Minor, currently managed without prescriptions  Low vitamin D level -     VITAMIN D 25 Hydroxy (Vit-D Deficiency, Fractures)  Body mass index (BMI) of 40.0 to 44.9 in adult [facility]) Long discussion about weight loss, diet, and exercise Discussed final goal weight  Patient on phentermine with benefit and no SE, will restart, taking drug breaks; continue close follow up. Return in 3 months   Screening cholesterol level -     Lipid panel  Screening for thyroid disorder -     TSH  Screening for diabetes mellitus -     Hemoglobin A1c  Anemia, unspecified type -     CBC with Differential/Platelet -     Vitamin B12  Discussed med's effects and SE's. Screening labs and tests as requested with regular follow-up as recommended. Over 40 minutes of exam, counseling, chart review and critical decision making was performed  Future Appointments  Date Time Provider Department Center  02/24/2019  2:00 PM [name], NP [facility] None     HPI  This very nice single AA 34 y.o.female presents for a complete physical. 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 She has Eczema; Body mass index (BMI) of 40.0 to 44.9 in adult [facility]); Low vitamin D level; and Essential hypertension on their problem list. 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   He endorses ongoing fatigue, was previously on B12 shots and reports this helped. She denies snoring. Reports she does awaken feeling rested. Denies AM headaches.   M ild eczema ongoing from childhood that she treats with OTC agents, and a lump to her left breast noted 8 years ago which was screened via [place] and MMG then biopsied; she reports this was "keloidal" and was not recommended any follow up.   She was referred to GYN last year but admits she never went. Wants a new referral.   This year she had ORIF of R ankle on 11/14/2017 by Dr. Martha Clan for displaced bimalleollar right ankle fracture after slipping at cheerleading practice. She reports she will also be having R knee scope in the next few weeks pending approval by insurance due to ligament sprain and lose cartilage fragment causing pain. She continues with PT twice weekly in the interim.  BMI is Body mass  index is 40.07 kg/m., she is working on diet and exercise. She has previously reported a 22 lb weight loss with phentermine but is currently off of this medication.   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 Denies CP, SOB.   Her blood pressure has been controlled at home, today their BP is BP: 109/70  She does workout. She denies chest pain, shortness of breath, dizziness.   She is not on cholesterol medication and denies myalgias.  Her cholesterol is at goal. The cholesterol last visit was:   Lab Results  Component Value Date   CHOL 147 11/21/2016   HDL 51 11/21/2016   LDLCALC 79 11/21/2016   TRIG 87 11/21/2016   CHOLHDL 2.9 11/21/2016    She has been working on diet and exercise for glucose management, and denies increased appetite, nausea, paresthesia of the feet, polydipsia, polyuria, visual disturbances and vomiting. Last A1C in the office was:  Lab Results  Component Value Date   HGBA1C 5.4 11/21/2016   Patient is on Vitamin D supplement, taking [id number] IU daily but admits to taking irregularly  Lab Results  Component Value Date   VD25OH 12 (L) 11/21/2016     Lab Results  Component Value Date   GFRAA >60 11/14/2017     Current Medications:  Current Outpatient Medications on File Prior to Visit  Medication Sig Dispense Refill  . aspirin EC 325 MG tablet Take 1 tablet (325 mg total) by mouth daily. 90 tablet 0  . Cholecalciferol (VITAMIN D3) 10000 units TABS Take 1 tablet by mouth daily.    Marland Kitchen. ibuprofen (ADVIL,MOTRIN) 400 MG tablet Take 400 mg by mouth every 6 (six) hours as needed for moderate pain.    Marland Kitchen. ondansetron (ZOFRAN) 4 MG tablet Take 1 tablet (4 mg total) by mouth every 8 (eight) hours as needed for nausea or vomiting. (Patient not taking: Reported on 02/23/2018) 20 tablet 0   No current facility-administered medications on file prior to visit.    Health Maintenance:   Immunization History  Administered Date(s) Administered  . PPD Test 11/21/2016  . Td 02/05/2012    TD/TDAP: 2015 Influenza: Declines Pneumovax: n/a Prevnar 13: n/a HPV vaccines: n/a  LMP: Patient's last menstrual period was 02/16/2018.  Sexually Active: yes, STD screening declined today will get at GYN [place] - never abnormal  MGM: Bilateral diagnostic 2013 related to L breast lump  Allergies: No Known Allergies Medical History:  has Eczema; Body mass index (BMI) of 40.0 to 44.9 in adult [facility]); Low  vitamin D level; and Essential hypertension on their problem list. Surgical History:  She  has a past surgical history that includes Cyst removal trunk (lower back); Breast biopsy (Left, 2010); Pilonidal cyst excision (2003); and ORIF ankle fracture (Right, 11/14/2017). Family History:  Her She was adopted. Family history is unknown by patient. Social History:   reports that she quit smoking about 15 years ago. Her smoking use included cigarettes. She smoked 0.50 packs per day. She has never used smokeless tobacco. She reports current alcohol use. She reports that she does not use drugs.  Review of Systems: Review of Systems  Constitutional: Positive for malaise/fatigue. Negative for weight loss.  HENT: Negative for congestion, hearing loss, nosebleeds and tinnitus.   Eyes: Negative for blurred  vision.  Respiratory: Negative for cough, shortness of breath and wheezing.   Cardiovascular: Negative for chest pain, palpitations and leg swelling.  Gastrointestinal: Negative for abdominal pain, blood in stool, constipation, diarrhea, heartburn, melena, nausea and vomiting.  Genitourinary: Negative.   Musculoskeletal: Positive for joint pain (R knee pain intermittently ). Negative for myalgias.  Skin: Negative.  Negative for rash.  Neurological: Negative for dizziness, sensory change and headaches.  Endo/Heme/Allergies: Positive for environmental allergies. Negative for polydipsia. Does not bruise/bleed easily.  Psychiatric/Behavioral: Negative.  Negative for depression and substance abuse. The patient is not nervous/anxious and does not have insomnia.     Physical Exam: Estimated body mass index is 40.07 kg/m as calculated from the following:   Height as of this encounter: 5' 2.75" (1.594 m).   Weight as of this encounter: 224 lb 6.4 oz (101.8 kg). BP 109/70   Pulse 95   Temp (!) 96.6 F (35.9 C)   Ht 5' 2.75" (1.594 m)   Wt 224 lb 6.4 oz (101.8 kg)   LMP 02/16/2018   SpO2 99%   BMI  40.07 kg/m  General Appearance: Well nourished, overweight female in no apparent distress.  Eyes: PERRLA, EOMs, conjunctiva no swelling or erythema, normal fundi and vessels.  Sinuses: No Frontal/maxillary tenderness  ENT/Mouth: Ext aud canals clear, normal light reflex with TMs without erythema, bulging. Good dentition. No erythema, swelling, or exudate on post pharynx. Tonsils not swollen or erythematous. Hearing normal.  Neck: Supple, thyroid normal. No bruits  Respiratory: Respiratory effort normal, BS equal bilaterally without rales, rhonchi, wheezing or stridor.  Cardio: RRR without murmurs, rubs or gallops. Brisk peripheral pulses without edema.  Chest: symmetric, with normal excursions and percussion.  Breasts: Defer to GYN Abdomen: Soft, nontender, no guarding, rebound, hernias, masses, or organomegaly.  Lymphatics: Non tender without lymphadenopathy.  Genitourinary: Defer to GYN Musculoskeletal: Full ROM all peripheral extremities,5/5 strength, and normal gait. Rankle in brace.  No obvious deformity, laxity, effusion.  Skin: Warm, dry without rashes, lesions, ecchymosis. Neuro: Cranial nerves intact, reflexes equal bilaterally. Normal muscle tone, no cerebellar symptoms. Sensation intact.  Psych: Awake and oriented X 3, normal affect, Insight and Judgment appropriate.   PHQ-2: 0  EKG: T wave inversion lead 1, RSR', overall WNL   Adriana Carr Adriana Carr 3:13 PM [facility]

## 2018-02-23 ENCOUNTER — Encounter: Payer: Self-pay | Admitting: Adult Health

## 2018-02-23 ENCOUNTER — Ambulatory Visit: Payer: BC Managed Care – PPO | Admitting: Adult Health

## 2018-02-23 ENCOUNTER — Encounter (INDEPENDENT_AMBULATORY_CARE_PROVIDER_SITE_OTHER): Payer: Self-pay

## 2018-02-23 VITALS — BP 109/70 | HR 95 | Temp 96.6°F | Ht 62.75 in | Wt 224.4 lb

## 2018-02-23 DIAGNOSIS — Z131 Encounter for screening for diabetes mellitus: Secondary | ICD-10-CM

## 2018-02-23 DIAGNOSIS — I10 Essential (primary) hypertension: Secondary | ICD-10-CM

## 2018-02-23 DIAGNOSIS — D649 Anemia, unspecified: Secondary | ICD-10-CM

## 2018-02-23 DIAGNOSIS — E559 Vitamin D deficiency, unspecified: Secondary | ICD-10-CM

## 2018-02-23 DIAGNOSIS — Z130 Encounter for screening for diseases of the blood and blood-forming organs and certain disorders involving the immune mechanism: Secondary | ICD-10-CM

## 2018-02-23 DIAGNOSIS — R5383 Other fatigue: Secondary | ICD-10-CM

## 2018-02-23 DIAGNOSIS — Z6841 Body Mass Index (BMI) 40.0 and over, adult: Secondary | ICD-10-CM

## 2018-02-23 DIAGNOSIS — Z1329 Encounter for screening for other suspected endocrine disorder: Secondary | ICD-10-CM

## 2018-02-23 DIAGNOSIS — Z136 Encounter for screening for cardiovascular disorders: Secondary | ICD-10-CM

## 2018-02-23 DIAGNOSIS — Z1389 Encounter for screening for other disorder: Secondary | ICD-10-CM

## 2018-02-23 DIAGNOSIS — R7989 Other specified abnormal findings of blood chemistry: Secondary | ICD-10-CM

## 2018-02-23 DIAGNOSIS — L309 Dermatitis, unspecified: Secondary | ICD-10-CM

## 2018-02-23 DIAGNOSIS — Z13220 Encounter for screening for lipoid disorders: Secondary | ICD-10-CM

## 2018-02-23 DIAGNOSIS — Z79899 Other long term (current) drug therapy: Secondary | ICD-10-CM

## 2018-02-23 DIAGNOSIS — Z124 Encounter for screening for malignant neoplasm of cervix: Secondary | ICD-10-CM

## 2018-02-23 DIAGNOSIS — Z0000 Encounter for general adult medical examination without abnormal findings: Secondary | ICD-10-CM

## 2018-02-23 MED ORDER — TRIAMTERENE-HCTZ 75-50 MG PO TABS: 1.0000 | ORAL_TABLET | Freq: Every day | ORAL | 1 refills | Status: DC | PRN

## 2018-02-23 MED ORDER — PHENTERMINE HCL 37.5 MG PO TABS: ORAL_TABLET | ORAL | 2 refills | Status: DC

## 2018-02-23 NOTE — Patient Instructions (Addendum)
Adriana Carr , Thank you for taking time to come for your Annual Wellness Visit. I appreciate your ongoing commitment to your health goals. Please review the following plan we discussed and let me know if I can assist you in the future.   These are the goals we discussed: Goals    . DIET - EAT MORE FRUITS AND VEGETABLES     7+ servings daily    . Exercise 150 min/wk Moderate Activity    . Weight (lb) < 210 lb (95.3 kg)       This is a list of the screening recommended for you and due dates:  Health Maintenance  Topic Date Due  . Pap Smear  08/16/2017  . Flu Shot  02/24/2018*  . Tetanus Vaccine  02/04/2022  . HIV Screening  Completed  *Topic was postponed. The date shown is not the original due date.     Know what a healthy weight is for you (roughly BMI <25) and aim to maintain this  Aim for 7+ servings of fruits and vegetables daily  65-80+ fluid ounces of water or unsweet tea for healthy kidneys  Limit to max 1 drink of alcohol per day; avoid smoking/tobacco  Limit animal fats in diet for cholesterol and heart health - choose grass fed whenever available  Avoid highly processed foods, and foods high in saturated/trans fats  Aim for low stress - take time to unwind and care for your mental health  Aim for 150 min of moderate intensity exercise weekly for heart health, and weights twice weekly for bone health  Aim for 7-9 hours of sleep daily     Drink 1/2 your body weight in fluid ounces of water daily; drink a tall glass of water 30 min before meals  Don't eat until you're stuffed- listen to your stomach and eat until you are 80% full   Try eating off of a salad plate; wait 10 min after finishing before going back for seconds  Start by eating the vegetables on your plate; aim for 50% of your meals to be fruits or vegetables  Then eat your protein - lean meats (grass fed if possible), fish, beans, nuts in moderation  Eat your carbs/starch last ONLY if you still  are hungry. If you can, stop before finishing it all  Avoid sugar and flour - the closer it looks to it's original form in nature, typically the better it is for you  Splurge in moderation - "assign" days when you get to splurge and have the "bad stuff" - I like to follow a 80% - 20% plan- "good" choices 80 % of the time, "bad" choices in moderation 20% of the time  Simple equation is: Calories out > calories in = weight loss - even if you eat the bad stuff, if you limit portions, you will still lose weight       When it comes to diets, agreement about the perfect plan isn't easy to find, even among the experts. Experts at the [employer] developed an idea known as the Healthy Eating Plate. Just imagine a plate divided into logical, healthy portions.  The emphasis is on diet quality:  Load up on vegetables and fruits - one-half of your plate: Aim for color and variety, and remember that potatoes don't count.  Go for whole grains - one-quarter of your plate: Whole wheat, barley, wheat berries, quinoa, oats, brown rice, and foods made with them. If you want pasta, go  with whole wheat pasta.  Protein power - one-quarter of your plate: Fish, chicken, beans, and nuts are all healthy, versatile protein sources. Limit red meat.  The diet, however, does go beyond the plate, offering a few other suggestions.  Use healthy plant oils, such as olive, canola, soy, corn, sunflower and peanut. Check the labels, and avoid partially hydrogenated oil, which have unhealthy trans fats.  If you're thirsty, drink water. Coffee and tea are good in moderation, but skip sugary drinks and limit milk and dairy products to one or two daily servings.  The type of carbohydrate in the diet is more important than the amount. Some sources of carbohydrates, such as vegetables, fruits, whole grains, and beans-are healthier than others.  Finally, stay active.     Fatigue If you have fatigue,  you feel tired all the time and have a lack of energy or a lack of motivation. Fatigue may make it difficult to start or complete tasks because of exhaustion. In general, occasional or mild fatigue is often a normal response to activity or life. However, long-lasting (chronic) or extreme fatigue may be a symptom of a medical condition. Follow these instructions at home: General instructions  Watch your fatigue for any changes.  Go to bed and get up at the same time every day.  Avoid fatigue by pacing yourself during the day and getting enough sleep at night.  Maintain a healthy weight. Medicines  Take over-the-counter and prescription medicines only as told by your health care provider.  Take a multivitamin, if told by your health care provider.  Do not use herbal or dietary supplements unless they are approved by your health care provider. Activity   Exercise regularly, as told by your health care provider.  Use or practice techniques to help you relax, such as yoga, tai chi, meditation, or massage therapy. Eating and drinking   Avoid heavy meals in the evening.  Eat a well-balanced diet, which includes lean proteins, whole grains, plenty of fruits and vegetables, and low-fat dairy products.  Avoid consuming too much caffeine.  Avoid the use of alcohol.  Drink enough fluid to keep your urine pale yellow. Lifestyle  Change situations that cause you stress. Try to keep your work and personal schedule in balance.  Do not use any products that contain nicotine or tobacco, such as cigarettes and e-cigarettes. If you need help quitting, ask your health care provider.  Do not use drugs. Contact a health care provider if:  Your fatigue does not get better.  You have a fever.  You suddenly lose or gain weight.  You have headaches.  You have trouble falling asleep or sleeping through the night.  You feel angry, guilty, anxious, or sad.  You are unable to have a bowel  movement (constipation).  Your skin is dry.  You have swelling in your legs or another part of your body. Get help right away if:  You feel confused.  Your vision is blurry.  You feel faint or you pass out.  You have a severe headache.  You have severe pain in your abdomen, your back, or the area between your waist and hips (pelvis).  You have chest pain, shortness of breath, or an irregular or fast heartbeat.  You are unable to urinate, or you urinate less than normal.  You have abnormal bleeding, such as bleeding from the rectum, vagina, nose, lungs, or nipples.  You vomit blood.  You have thoughts about hurting yourself or others. If  you ever feel like you may hurt yourself or others, or have thoughts about taking your own life, get help right away. You can go to your nearest emergency department or call:  Your local emergency services (911 in the [place]  A suicide crisis helpline, such as the [facility] at 971-390-3280. This is open 24 hours a day. Summary  If you have fatigue, you feel tired all the time and have a lack of energy or a lack of motivation.  Fatigue may make it difficult to start or complete tasks because of exhaustion.  Long-lasting (chronic) or extreme fatigue may be a symptom of a medical condition.  Exercise regularly, as told by your health care provider.  Change situations that cause you stress. Try to keep your work and personal schedule in balance. This information is not intended to replace advice given to you by your health care provider. Make sure you discuss any questions you have with your health care provider. Document Released: 11/18/2006 Document Revised: 10/16/2016 Document Reviewed: 10/16/2016 Elsevier Interactive Patient Education  [employer].

## 2018-02-24 ENCOUNTER — Encounter: Payer: Self-pay | Admitting: Adult Health

## 2018-02-24 DIAGNOSIS — E538 Deficiency of other specified B group vitamins: Secondary | ICD-10-CM | POA: Insufficient documentation

## 2018-02-24 DIAGNOSIS — R5383 Other fatigue: Secondary | ICD-10-CM | POA: Insufficient documentation

## 2018-02-24 LAB — CBC WITH DIFFERENTIAL/PLATELET
Absolute Monocytes: 421 cells/uL (ref 200–950)
BASOS ABS: 49 {cells}/uL (ref 0–200)
Basophils Relative: 0.6 %
EOS PCT: 1.5 %
Eosinophils Absolute: 122 cells/uL (ref 15–500)
HCT: 40.2 % (ref 35.0–45.0)
HEMOGLOBIN: 13.5 g/dL (ref 11.7–15.5)
Lymphs Abs: 2892 cells/uL (ref 850–3900)
MCH: 27.8 pg (ref 27.0–33.0)
MCHC: 33.6 g/dL (ref 32.0–36.0)
MCV: 82.9 fL (ref 80.0–100.0)
MONOS PCT: 5.2 %
MPV: 10.3 fL (ref 7.5–12.5)
NEUTROS ABS: 4617 {cells}/uL (ref 1500–7800)
Neutrophils Relative %: 57 %
PLATELETS: 487 10*3/uL — AB (ref 140–400)
RBC: 4.85 10*6/uL (ref 3.80–5.10)
RDW: 12.3 % (ref 11.0–15.0)
TOTAL LYMPHOCYTE: 35.7 %
WBC: 8.1 10*3/uL (ref 3.8–10.8)

## 2018-02-24 LAB — URINALYSIS, ROUTINE W REFLEX MICROSCOPIC
Bilirubin Urine: NEGATIVE
Glucose, UA: NEGATIVE
HGB URINE DIPSTICK: NEGATIVE
Ketones, ur: NEGATIVE
LEUKOCYTES UA: NEGATIVE
NITRITE: NEGATIVE
PROTEIN: NEGATIVE
Specific Gravity, Urine: 1.016 (ref 1.001–1.03)

## 2018-02-24 LAB — COMPLETE METABOLIC PANEL WITH GFR
AG Ratio: 1.5 (calc) (ref 1.0–2.5)
ALKALINE PHOSPHATASE (APISO): 94 U/L (ref 33–115)
ALT: 11 U/L (ref 6–29)
AST: 13 U/L (ref 10–30)
Albumin: 4.6 g/dL (ref 3.6–5.1)
BUN: 7 mg/dL (ref 7–25)
CO2: 28 mmol/L (ref 20–32)
CREATININE: 0.76 mg/dL (ref 0.50–1.10)
Calcium: 10.1 mg/dL (ref 8.6–10.2)
Chloride: 101 mmol/L (ref 98–110)
GFR, Est African American: 119 mL/min/{1.73_m2} (ref >=60)
GFR, Est Non African American: 102 mL/min/{1.73_m2} (ref >=60)
GLUCOSE: 87 mg/dL (ref 65–99)
Globulin: 3.1 g/dL (calc) (ref 1.9–3.7)
Potassium: 4 mmol/L (ref 3.5–5.3)
Sodium: 139 mmol/L (ref 135–146)
Total Bilirubin: 0.2 mg/dL (ref 0.2–1.2)
Total Protein: 7.7 g/dL (ref 6.1–8.1)

## 2018-02-24 LAB — VITAMIN B12: VITAMIN B 12: 379 pg/mL (ref 200–1100)

## 2018-02-24 LAB — LIPID PANEL
CHOL/HDL RATIO: 3.3 (calc) (ref <5.0)
Cholesterol: 159 mg/dL (ref <200)
HDL: 48 mg/dL — ABNORMAL LOW (ref >50)
LDL CHOLESTEROL (CALC): 94 mg/dL
Non-HDL Cholesterol (Calc): 111 mg/dL (calc) (ref <130)
TRIGLYCERIDES: 83 mg/dL (ref <150)

## 2018-02-24 LAB — MAGNESIUM: MAGNESIUM: 1.9 mg/dL (ref 1.5–2.5)

## 2018-02-24 LAB — TSH: TSH: 1.28 mIU/L

## 2018-02-24 LAB — HEMOGLOBIN A1C
Hgb A1c MFr Bld: 5.5 % of total Hgb (ref <5.7)
Mean Plasma Glucose: 111 (calc)
eAG (mmol/L): 6.2 (calc)

## 2018-02-24 LAB — VITAMIN D 25 HYDROXY (VIT D DEFICIENCY, FRACTURES): VIT D 25 HYDROXY: 27 ng/mL — AB (ref 30–100)

## 2018-03-04 ENCOUNTER — Other Ambulatory Visit: Payer: Self-pay | Admitting: Orthopedic Surgery

## 2018-03-10 ENCOUNTER — Inpatient Hospital Stay: Admission: RE | Admit: 2018-03-10 | Payer: Self-pay | Source: Ambulatory Visit

## 2018-03-11 ENCOUNTER — Other Ambulatory Visit: Payer: Self-pay

## 2018-03-11 ENCOUNTER — Encounter
Admission: RE | Admit: 2018-03-11 | Discharge: 2018-03-11 | Disposition: A | Discharge status: Home / Self Care | Payer: BC Managed Care – PPO | Source: Ambulatory Visit | Attending: Orthopedic Surgery | Admitting: Orthopedic Surgery

## 2018-03-11 MED ORDER — CEFAZOLIN SODIUM-DEXTROSE 2-4 GM/100ML-% IV SOLN
2.0000 g | INTRAVENOUS | Status: AC
  Administered 2018-03-12: 2 g via INTRAVENOUS

## 2018-03-11 NOTE — Patient Instructions (Signed)
Your procedure is scheduled on: 03-12-18  Report to Same Day Surgery 2nd floor medical mall [facility] Entrance-take elevator on left to 2nd floor.  Check in with surgery information desk.) @ 7:45 PER PT  Remember: Instructions that are not followed completely may result in serious medical risk, up to and including death, or upon the discretion of your surgeon and anesthesiologist your surgery may need to be rescheduled.    _x___ 1. Do not eat food after midnight the night before your procedure. You may drink clear liquids up to 2 hours before you are scheduled to arrive at the hospital for your procedure.  Do not drink clear liquids within 2 hours of your scheduled arrival to the hospital.  Clear liquids include  --Water or Apple juice without pulp  --Clear carbohydrate beverage such as ClearFast or Gatorade  --Black Coffee or Clear Tea (No milk, no creamers, do not add anything to the coffee or Tea   ____Ensure clear carbohydrate drink on the way to the hospital for bariatric patients  ____Ensure clear carbohydrate drink 3 hours before surgery for Dr Rutherford Nail patients if physician instructed.   No gum chewing or hard candies.     __x__ 2. No Alcohol for 24 hours before or after surgery.   __x__3. No Smoking or e-cigarettes for 24 prior to surgery.  Do not use any chewable tobacco products for at least 6 hour prior to surgery   ____  4. Bring all medications with you on the day of surgery if instructed.    __x__ 5. Notify your doctor if there is any change in your medical condition     (cold, fever, infections).    x___6. On the morning of surgery brush your teeth with toothpaste and water.  You may rinse your mouth with mouth wash if you wish.  Do not swallow any toothpaste or mouthwash.   Do not wear jewelry, make-up, hairpins, clips or nail polish.  Do not wear lotions, powders, or perfumes. You may wear deodorant.  Do not shave 48 hours prior to surgery. Men may shave face and  neck.  Do not bring valuables to the hospital.    [facility] is not responsible for any belongings or valuables.               Contacts, dentures or bridgework may not be worn into surgery.  Leave your suitcase in the car. After surgery it may be brought to your room.  For patients admitted to the hospital, discharge time is determined by your treatment team.  _  Patients discharged the day of surgery will not be allowed to drive home.  You will need someone to drive you home and stay with you the night of your procedure.    Please read over the following fact sheets that you were given:   [facility] Preparing for Surgery   ____ Take anti-hypertensive listed below, cardiac, seizure, asthma,     anti-reflux and psychiatric medicines. These include:  1. NONE  2.  3.  4.  5.  6.  ____Fleets enema or Magnesium Citrate as directed.   _x___ Use CHG Soap or sage wipes as directed on instruction sheet   ____ Use inhalers on the day of surgery and bring to hospital day of surgery  ____ Stop Metformin and Janumet 2 days prior to surgery.    ____ Take 1/2 of usual insulin dose the night before surgery and none on the morning surgery.   ____ Follow  recommendations from Cardiologist, Pulmonologist or PCP regarding stopping Aspirin, Coumadin, Plavix ,Eliquis, Effient, or Pradaxa, and Pletal.  X____Stop Anti-inflammatories such as Advil, Aleve, Ibuprofen, Motrin, Naproxen,MELOXICAM, Naprosyn, Goodies powders or aspirin products NOW-OK to take Tylenol   _X___ Stop supplements until after surgery-PT STOPPED PHENTERMINE 2 DAYS AGO   ____ Bring C-Pap to the hospital.

## 2018-03-12 ENCOUNTER — Ambulatory Visit: Payer: No Typology Code available for payment source | Admitting: Anesthesiology

## 2018-03-12 ENCOUNTER — Encounter
Admission: RE | Disposition: A | Discharge status: Home / Self Care | Payer: Self-pay | Source: Home / Self Care | Attending: Orthopedic Surgery

## 2018-03-12 ENCOUNTER — Ambulatory Visit
Admission: RE | Admit: 2018-03-12 | Discharge: 2018-03-12 | Disposition: A | Discharge status: Home / Self Care | Payer: No Typology Code available for payment source | Attending: Orthopedic Surgery | Admitting: Orthopedic Surgery

## 2018-03-12 ENCOUNTER — Other Ambulatory Visit: Payer: Self-pay

## 2018-03-12 ENCOUNTER — Encounter: Payer: Self-pay | Admitting: *Deleted

## 2018-03-12 DIAGNOSIS — Z791 Long term (current) use of non-steroidal anti-inflammatories (NSAID): Secondary | ICD-10-CM | POA: Diagnosis not present

## 2018-03-12 DIAGNOSIS — M2341 Loose body in knee, right knee: Secondary | ICD-10-CM | POA: Insufficient documentation

## 2018-03-12 DIAGNOSIS — Z7982 Long term (current) use of aspirin: Secondary | ICD-10-CM | POA: Diagnosis not present

## 2018-03-12 DIAGNOSIS — Z79899 Other long term (current) drug therapy: Secondary | ICD-10-CM | POA: Diagnosis not present

## 2018-03-12 DIAGNOSIS — Z87891 Personal history of nicotine dependence: Secondary | ICD-10-CM | POA: Insufficient documentation

## 2018-03-12 DIAGNOSIS — Z6841 Body Mass Index (BMI) 40.0 and over, adult: Secondary | ICD-10-CM | POA: Diagnosis not present

## 2018-03-12 HISTORY — PX: KNEE ARTHROSCOPY: SHX127

## 2018-03-12 LAB — POCT PREGNANCY, URINE: PREG TEST UR: NEGATIVE

## 2018-03-12 SURGERY — ARTHROSCOPY, KNEE
Anesthesia: General | Site: Knee | Laterality: Right

## 2018-03-12 MED ORDER — BUPIVACAINE-EPINEPHRINE (PF) 0.25% -1:200000 IJ SOLN
INTRAMUSCULAR | Status: AC
  Filled 2018-03-12: qty 30

## 2018-03-12 MED ORDER — DEXAMETHASONE SODIUM PHOSPHATE 10 MG/ML IJ SOLN
INTRAMUSCULAR | Status: DC | PRN
  Administered 2018-03-12: 10 mg via INTRAVENOUS

## 2018-03-12 MED ORDER — FAMOTIDINE 20 MG PO TABS
20.0000 mg | ORAL_TABLET | Freq: Once | ORAL | Status: AC
  Administered 2018-03-12: 20 mg via ORAL

## 2018-03-12 MED ORDER — HYDROCODONE-ACETAMINOPHEN 5-325 MG PO TABS: 1.0000 | ORAL_TABLET | ORAL | 0 refills | Status: DC | PRN

## 2018-03-12 MED ORDER — PROPOFOL 10 MG/ML IV BOLUS
INTRAVENOUS | Status: DC | PRN
  Administered 2018-03-12: 200 mg via INTRAVENOUS

## 2018-03-12 MED ORDER — FENTANYL CITRATE (PF) 100 MCG/2ML IJ SOLN
INTRAMUSCULAR | Status: DC | PRN
  Administered 2018-03-12 (×2): 50 ug via INTRAVENOUS

## 2018-03-12 MED ORDER — MIDAZOLAM HCL 2 MG/2ML IJ SOLN
INTRAMUSCULAR | Status: DC | PRN
  Administered 2018-03-12: 2 mg via INTRAVENOUS

## 2018-03-12 MED ORDER — CEFAZOLIN SODIUM-DEXTROSE 1-4 GM/50ML-% IV SOLN
INTRAVENOUS | Status: DC | PRN
  Administered 2018-03-12: 1 g via INTRAVENOUS

## 2018-03-12 MED ORDER — LACTATED RINGERS IV SOLN
INTRAVENOUS | Status: DC
  Administered 2018-03-12: 09:00:00 via INTRAVENOUS

## 2018-03-12 MED ORDER — FENTANYL CITRATE (PF) 100 MCG/2ML IJ SOLN
INTRAMUSCULAR | Status: AC
  Filled 2018-03-12: qty 2

## 2018-03-12 MED ORDER — CHLORHEXIDINE GLUCONATE CLOTH 2 % EX PADS: 6.0000 | MEDICATED_PAD | Freq: Once | CUTANEOUS | Status: DC

## 2018-03-12 MED ORDER — FENTANYL CITRATE (PF) 100 MCG/2ML IJ SOLN
25.0000 ug | INTRAMUSCULAR | Status: DC | PRN
  Administered 2018-03-12: 25 ug via INTRAVENOUS

## 2018-03-12 MED ORDER — MIDAZOLAM HCL 2 MG/2ML IJ SOLN
INTRAMUSCULAR | Status: AC
  Filled 2018-03-12: qty 2

## 2018-03-12 MED ORDER — OXYCODONE HCL 5 MG/5ML PO SOLN: 5.0000 mg | Freq: Once | ORAL | Status: AC | PRN

## 2018-03-12 MED ORDER — ONDANSETRON HCL 4 MG/2ML IJ SOLN
INTRAMUSCULAR | Status: DC | PRN
  Administered 2018-03-12: 4 mg via INTRAVENOUS

## 2018-03-12 MED ORDER — CEFAZOLIN SODIUM-DEXTROSE 2-4 GM/100ML-% IV SOLN
INTRAVENOUS | Status: AC
  Filled 2018-03-12: qty 100

## 2018-03-12 MED ORDER — FENTANYL CITRATE (PF) 100 MCG/2ML IJ SOLN
INTRAMUSCULAR | Status: AC
  Administered 2018-03-12: 25 ug via INTRAVENOUS
  Filled 2018-03-12: qty 2

## 2018-03-12 MED ORDER — LIDOCAINE HCL (PF) 1 % IJ SOLN
INTRAMUSCULAR | Status: AC
  Filled 2018-03-12: qty 30

## 2018-03-12 MED ORDER — OXYCODONE HCL 5 MG PO TABS
5.0000 mg | ORAL_TABLET | Freq: Once | ORAL | Status: AC | PRN
Start: 2018-03-12 — End: 2018-03-12
  Administered 2018-03-12: 5 mg via ORAL

## 2018-03-12 MED ORDER — FAMOTIDINE 20 MG PO TABS
ORAL_TABLET | ORAL | Status: AC
  Filled 2018-03-12: qty 1

## 2018-03-12 MED ORDER — BUPIVACAINE-EPINEPHRINE (PF) 0.25% -1:200000 IJ SOLN
INTRAMUSCULAR | Status: DC | PRN
  Administered 2018-03-12: 30 mL

## 2018-03-12 MED ORDER — PROPOFOL 10 MG/ML IV BOLUS
INTRAVENOUS | Status: AC
  Filled 2018-03-12: qty 20

## 2018-03-12 MED ORDER — OXYCODONE HCL 5 MG PO TABS
ORAL_TABLET | ORAL | Status: AC
  Filled 2018-03-12: qty 1

## 2018-03-12 SURGICAL SUPPLY — 45 items
ADAPTER IRRIG TUBE 2 SPIKE SOL (ADAPTER) ×6 IMPLANT
ADPR TBG 2 SPK PMP STRL ASCP (ADAPTER) ×2
BUR RADIUS 3.5 (BURR) ×3 IMPLANT
BUR RADIUS 4.0X18.5 (BURR) ×3 IMPLANT
CANISTER SUCT LVC 12 LTR MEDI- (MISCELLANEOUS) ×1 IMPLANT
CLOSURE WOUND 1/2 X4 (GAUZE/BANDAGES/DRESSINGS) ×1
COOLER POLAR GLACIER W/PUMP (MISCELLANEOUS) IMPLANT
COVER WAND RF STERILE (DRAPES) ×3 IMPLANT
CUFF TOURN 24 STER (MISCELLANEOUS) IMPLANT
CUFF TOURN 30 STER DUAL PORT (MISCELLANEOUS) ×2 IMPLANT
DEVICE SUCT BLK HOLE OR FLOOR (MISCELLANEOUS) ×1 IMPLANT
DRAPE IMP U-DRAPE 54X76 (DRAPES) ×3 IMPLANT
DURAPREP 26ML APPLICATOR (WOUND CARE) ×7 IMPLANT
GAUZE PETRO XEROFOAM 1X8 (MISCELLANEOUS) ×3 IMPLANT
GAUZE SPONGE 4X4 12PLY STRL (GAUZE/BANDAGES/DRESSINGS) ×3 IMPLANT
GLOVE BIOGEL PI IND STRL 7.5 (GLOVE) IMPLANT
GLOVE BIOGEL PI IND STRL 9 (GLOVE) ×1 IMPLANT
GLOVE BIOGEL PI INDICATOR 7.5 (GLOVE) ×14
GLOVE BIOGEL PI INDICATOR 9 (GLOVE) ×2
GLOVE SURG 9.0 ORTHO LTXF (GLOVE) ×6 IMPLANT
GOWN STRL REUS TWL 2XL XL LVL4 (GOWN DISPOSABLE) ×3 IMPLANT
GOWN STRL REUS W/ TWL LRG LVL3 (GOWN DISPOSABLE) ×1 IMPLANT
GOWN STRL REUS W/TWL LRG LVL3 (GOWN DISPOSABLE) ×12
IV LACTATED RINGER IRRG 3000ML (IV SOLUTION) ×18
IV LR IRRIG 3000ML ARTHROMATIC (IV SOLUTION) ×4 IMPLANT
KIT TURNOVER KIT A (KITS) ×3 IMPLANT
MANIFOLD NEPTUNE II (INSTRUMENTS) ×3 IMPLANT
MAT ABSORB  FLUID 56X50 GRAY (MISCELLANEOUS) ×2
MAT ABSORB FLUID 56X50 GRAY (MISCELLANEOUS) ×2 IMPLANT
NEEDLE HYPO 22GX1.5 SAFETY (NEEDLE) ×3 IMPLANT
PACK ARTHROSCOPY KNEE (MISCELLANEOUS) ×3 IMPLANT
PAD ABD DERMACEA PRESS 5X9 (GAUZE/BANDAGES/DRESSINGS) ×6 IMPLANT
PAD WRAPON POLAR KNEE (MISCELLANEOUS) ×1 IMPLANT
SET TUBE SUCT SHAVER OUTFL 24K (TUBING) ×3 IMPLANT
SET TUBE TIP INTRA-ARTICULAR (MISCELLANEOUS) ×3 IMPLANT
SOL PREP PVP 2OZ (MISCELLANEOUS)
SOLUTION PREP PVP 2OZ (MISCELLANEOUS) IMPLANT
STRIP CLOSURE SKIN 1/2X4 (GAUZE/BANDAGES/DRESSINGS) ×2 IMPLANT
SUT ETHILON 4-0 (SUTURE) ×3
SUT ETHILON 4-0 FS2 18XMFL BLK (SUTURE) ×1
SUTURE ETHLN 4-0 FS2 18XMF BLK (SUTURE) ×1 IMPLANT
TUBING ARTHRO INFLOW-ONLY STRL (TUBING) ×3 IMPLANT
WAND HAND CNTRL MULTIVAC 50 (MISCELLANEOUS) IMPLANT
WAND HAND CNTRL MULTIVAC 90 (MISCELLANEOUS) ×3 IMPLANT
WRAPON POLAR PAD KNEE (MISCELLANEOUS) ×3

## 2018-03-12 NOTE — Anesthesia Procedure Notes (Signed)
Procedure Name: LMA Insertion Date/Time: 03/12/2018 11:09 AM Performed by: Danelle Berry, CRNA Pre-anesthesia Checklist: Patient identified, Emergency Drugs available, Suction available, Patient being monitored and Timeout performed Patient Re-evaluated:Patient Re-evaluated prior to induction Oxygen Delivery Method: Circle system utilized and Simple face mask Preoxygenation: Pre-oxygenation with 100% oxygen Induction Type: IV induction Ventilation: Mask ventilation without difficulty LMA Size: 4.0 Number of attempts: 1 Placement Confirmation: positive ETCO2 Dental Injury: Teeth and Oropharynx as per pre-operative assessment

## 2018-03-12 NOTE — Discharge Instructions (Signed)
  AMBULATORY SURGERY  DISCHARGE INSTRUCTIONS   1) The drugs that you were given will stay in your system until tomorrow so for the next 24 hours you should not:  A) Drive an automobile B) Make any legal decisions C) Drink any alcoholic beverage   2) You may resume regular meals tomorrow.  Today it is better to start with liquids and gradually work up to solid foods.  You may eat anything you prefer, but it is better to start with liquids, then soup and crackers, and gradually work up to solid foods.   3) Please notify your doctor immediately if you have any unusual bleeding, trouble breathing, redness and pain at the surgery site, drainage, fever, or pain not relieved by medication.    4) Additional Instructions: TAKE A STOOL SOFTENER TWICE A DAY WHILE TAKING NARCOTIC PAIN MEDICINE TO PREVENT CONSTIPATION   Please contact your physician with any problems or Same Day Surgery at [phone], Monday through Friday 6 am to 4 pm, or [facility] Main number at [phone].   

## 2018-03-12 NOTE — Anesthesia Preprocedure Evaluation (Signed)
Anesthesia Evaluation  Patient identified by MRN, date of birth, ID band Patient awake    Reviewed: Allergy & Precautions, H&P , NPO status , Patient's Chart, lab work & pertinent test results  History of Anesthesia Complications Negative for: history of anesthetic complications  Airway Mallampati: III  TM Distance: >3 FB Neck ROM: full    Dental  (+) Chipped   Pulmonary neg shortness of breath, former smoker,           Cardiovascular Exercise Tolerance: Good hypertension, (-) angina(-) Past MI and (-) DOE      Neuro/Psych negative neurological ROS  negative psych ROS   GI/Hepatic negative GI ROS, Neg liver ROS, neg GERD  ,  Endo/Other  Morbid obesity  Renal/GU      Musculoskeletal   Abdominal   Peds  Hematology negative hematology ROS (+)   Anesthesia Other Findings Past Medical History: No date: Allergy     Comment:  seasonal No date: Fluid retention in legs     Comment:  takes maxide prn  Past Surgical History: 2010: BREAST BIOPSY; Left     Comment:  Keloidal growth per patient lower back: CYST REMOVAL TRUNK 11/14/2017: ORIF ANKLE FRACTURE; Right     Comment:  Procedure: OPEN REDUCTION INTERNAL FIXATION (ORIF) ANKLE              FRACTURE;  Surgeon: Juanell Fairly, MD;  Location: [facility]              ORS;  Service: Orthopedics;  Laterality: Right; 2003: PILONIDAL CYST EXCISION  BMI    Body Mass Index:  41.26 kg/m      Reproductive/Obstetrics negative OB ROS                             Anesthesia Physical Anesthesia Plan  ASA: III  Anesthesia Plan: General LMA   Post-op Pain Management:    Induction: Intravenous  PONV Risk Score and Plan: Dexamethasone, Ondansetron, Midazolam and Treatment may vary due to age or medical condition  Airway Management Planned: LMA  Additional Equipment:   Intra-op Plan:   Post-operative Plan: Extubation in OR  Informed Consent:  I have reviewed the patients History and Physical, chart, labs and discussed the procedure including the risks, benefits and alternatives for the proposed anesthesia with the patient or authorized representative who has indicated his/her understanding and acceptance.     Dental Advisory Given  Plan Discussed with: Anesthesiologist, CRNA and Surgeon  Anesthesia Plan Comments: (Patient consented for risks of anesthesia including but not limited to:  - adverse reactions to medications - damage to teeth, lips or other oral mucosa - sore throat or hoarseness - Damage to heart, brain, lungs or loss of life  Patient voiced understanding.)        Anesthesia Quick Evaluation

## 2018-03-12 NOTE — Anesthesia Postprocedure Evaluation (Signed)
Anesthesia Post Note  Patient: Adriana Carr  Procedure(s) Performed: ARTHROSCOPY-RIGHT KNEE, REMOVAL OF LOOSE BODY (Right Knee)  Patient location during evaluation: PACU Anesthesia Type: General Level of consciousness: awake and alert Pain management: pain level controlled Vital Signs Assessment: post-procedure vital signs reviewed and stable Respiratory status: spontaneous breathing, nonlabored ventilation, respiratory function stable and patient connected to nasal cannula oxygen Cardiovascular status: blood pressure returned to baseline and stable Postop Assessment: no apparent nausea or vomiting Anesthetic complications: no     Last Vitals:  Vitals:   03/12/18 1311 03/12/18 1320  BP:  100/67  Pulse: 71 66  Resp: 18 17  Temp:  36.5 C  SpO2: 96% 95%    Last Pain:  Vitals:   03/12/18 1320  TempSrc:   PainSc: 5                  Cleda Mccreedy Raidon Swanner

## 2018-03-12 NOTE — Transfer of Care (Signed)
Immediate Anesthesia Transfer of Care Note  Patient: Adriana Carr  Procedure(s) Performed: ARTHROSCOPY-RIGHT KNEE, REMOVAL OF LOOSE BODY (Right Knee)  Patient Location: PACU  Anesthesia Type:General  Level of Consciousness: awake, alert  and oriented  Airway & Oxygen Therapy: Patient Spontanous Breathing  Post-op Assessment: Report given to RN and Post -op Vital signs reviewed and stable  Post vital signs: Reviewed and stable  Last Vitals:  Vitals Value Taken Time  BP    Temp    Pulse 91 03/12/2018 12:32 PM  Resp    SpO2 99 % 03/12/2018 12:32 PM  Vitals shown include unvalidated device data.  Last Pain:  Vitals:   03/12/18 0833  TempSrc: Tympanic  PainSc: 3       Patients Stated Pain Goal: 2 (03/12/18 0600)  Complications: No apparent anesthesia complications

## 2018-03-12 NOTE — Op Note (Signed)
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 female with a diagnosis of LOOSE BODY IN RIGHT KNEE JOINT who failed conservative treatment and had persistent mechanical symptoms and elected for surgical management.    I discussed the risks and benefits of surgery. The risks include but are not limited to infection, bleeding requiring blood transfusion, nerve or blood vessel injury, joint stiffness or loss of motion, persistent pain, weakness or instability, malunion, nonunion and hardware failure and the need for further surgery. Medical risks include but are not limited to DVT and pulmonary embolism, myocardial infarction, stroke, pneumonia, respiratory failure and death. Patient understood these risks and wished to proceed.   OPERATIVE FINDINGS: Chondral loose bodies x 3 and softening of cartilage over lateral tibial plateau, medial femoral condyle and medial patella facet  OPERATIVE PROCEDURE: Patient was seen in the preop area with her family at the bedside.  I marked the right knee according the hospitals correct site of surgery protocol.  I answered all questions by the patient and her family.  A preop H&P was performed.  Patient was then brought to the operating room where she was placed supine on the operative table.  She underwent general anesthesia.  She was prepped and draped in a sterile fashion.  A timeout was performed to verify the patient's name, date of birth, medical record number, correct site of surgery and correct procedure to be performed.  She received 3 g of Ancef prior to the onset of the case.  Once all in  attendance were in agreement the case began.  Examination under anesthesia revealed no ligamentous laxity and range of motion from 0 to 120 degrees.  She was pre-injected with 1% lidocaine plain and the proposed arthroscopy incision sites.  A inferior lateral incision was made with 11 blade.  The arthroscope was placed into the knee joint through this incision and a full diagnostic examination was performed including the suprapatellar pouch, the medial and lateral gutters, the intercondylar notch and the medial and lateral compartments and posterior knee.  Things over the arthroscopy are detailed above.  Patient had 3 chondral loose bodies removed from the medial portal using a pituitary grasper.  The largest of these 3 chondral loose bodies was to centimeters.  She had a limited synovectomy the anterior portion of the knee.  The tear was intact.  There is no focal chondral lesions in the medial or lateral compartments but she had softening of the lateral tibial plateau and medial femoral condyle.  Patient had significant softening of the medial facet of the patella as well but there is no trochlear lesions.  The knee was then copiously irrigated.  All arthroscopic instruments were then removed.  Patient had the 2 arthroscopy portals closed with 4-0 nylon.  She had an intra-articular injection with quarter percent Marcaine plain with epinephrine.  Patient had a dry sterile dressing applied.  A Polar Care was also placed in the right knee.  Patient was awoken and brought to the PACU in stable condition.  All sharps, function instrument counts were correct at the conclusion the case.  I were  scrubbed and present for the entire case.  I spoke with the patient's family in the postoperative consultation room to let them know the patient was stable in the recovery room in the procedure was performed without complication.    Adriana DevoidKevin L Yukio Bisping, MD

## 2018-03-12 NOTE — Anesthesia Post-op Follow-up Note (Signed)
Anesthesia QCDR form completed.        

## 2018-03-12 NOTE — H&P (Signed)
PREOPERATIVE H&P  Chief Complaint: LOOSE BODY IN RIGHT KNEE JOINT  HPI: Adriana Carr is a 33 y.o. female who presents for preoperative history and physical with a diagnosis of LOOSE BODY IN RIGHT KNEE JOINT. Mechanical symptoms are rated as moderate to severe, and have been worsening.  This is significantly impairing activities of daily living.  She has elected for surgical management.   Past Medical History:  Diagnosis Date  . Allergy    seasonal  . Fluid retention in legs    takes maxide prn   Past Surgical History:  Procedure Laterality Date  . BREAST BIOPSY Left 2010   Keloidal growth per patient  . CYST REMOVAL TRUNK  lower back  . ORIF ANKLE FRACTURE Right 11/14/2017   Procedure: OPEN REDUCTION INTERNAL FIXATION (ORIF) ANKLE FRACTURE;  Surgeon: Juanell Fairly, MD;  Location: [facility] ORS;  Service: Orthopedics;  Laterality: Right;  . PILONIDAL CYST EXCISION  2003   Social History   Socioeconomic History  . Marital status: Single    Spouse name: Not on file  . Number of children: Not on file  . Years of education: Not on file  . Highest education level: Not on file  Occupational History  . Occupation: Runner, broadcasting/film/video  Social Needs  . Financial resource strain: Not on file  . Food insecurity:    Worry: Not on file    Inability: Not on file  . Transportation needs:    Medical: Not on file    Non-medical: Not on file  Tobacco Use  . Smoking status: Former Smoker    Packs/day: 0.50    Types: Cigarettes    Last attempt to quit: 11/30/2002    Years since quitting: 15.2  . Smokeless tobacco: Never Used  Substance and Sexual Activity  . Alcohol use: Yes    Comment: rarely  . Drug use: No  . Sexual activity: Not Currently    Birth control/protection: Inserts  Lifestyle  . Physical activity:    Days per week: 2 days    Minutes per session: 60 min  . Stress: Not on file  Relationships  . Social connections:    Talks on phone: Not on file    Gets together: Not on file    Attends religious service: Not on file    Active member of club or organization: Not on file    Attends meetings of clubs or organizations: Not on file    Relationship status: Not on file  Other Topics Concern  . Not on file  Social History Narrative  . Not on file   Family History  Adopted: Yes  Family history unknown: Yes   No Known Allergies Prior to Admission medications   Medication Sig Start Date End Date Taking? Authorizing Provider  aspirin EC 325 MG tablet Take 1 tablet (325 mg total) by mouth daily. Patient not taking: Reported on 03/11/2018 11/14/17  Yes Juanell Fairly, MD  Cholecalciferol (VITAMIN D3) 10000 units TABS Take 1 tablet by mouth daily. WHEN SHE REMEMBERS   Yes [provider]  meloxicam (MOBIC) 15 MG tablet Take 15 mg by mouth daily as needed for pain.   Yes [provider]  Multiple Vitamin (MULTIVITAMIN WITH MINERALS) TABS tablet Take 1 tablet by mouth daily.   Yes [provider]  phentermine (ADIPEX-P) 37.5 MG tablet Take 1/2 to 1 tablet every morning for dieting & weightloss Patient taking differently: Take 37.5 mg by mouth daily before breakfast. Take 1/2 to 1 tablet every morning  for dieting & weightloss 02/23/18  Yes Judd Gaudierorbett, Ashley, NP  triamterene-hydrochlorothiazide (MAXZIDE) 75-50 MG tablet Take 1 tablet by mouth daily as needed (fluid). 02/23/18 02/23/19 Yes Judd Gaudierorbett, Ashley, NP  vitamin B-12 (CYANOCOBALAMIN) 500 MCG tablet Take 500 mcg by mouth daily. WHEN SHE REMEMBERS   Yes [provider]  ondansetron (ZOFRAN) 4 MG tablet Take 1 tablet (4 mg total) by mouth every 8 (eight) hours as needed for nausea or vomiting. Patient not taking: Reported on 02/23/2018 11/14/17   Juanell FairlyKrasinski, Shanaye Rief, MD     Positive ROS: All other systems have been reviewed and were otherwise negative with the exception of those mentioned in the HPI and as above.  Physical Exam: General: Alert, no acute distress Cardiovascular: Regular rate and  rhythm, no murmurs rubs or gallops.  No pedal edema Respiratory: Clear to auscultation bilaterally, no wheezes rales or rhonchi. No cyanosis, no use of accessory musculature GI: No organomegaly, abdomen is soft and non-tender nondistended with positive bowel sounds. Skin: Skin intact, no lesions within the operative field. Neurologic: Sensation intact distally Psychiatric: Patient is competent for consent with normal mood and affect Lymphatic: No cervical lymphadenopathy  MUSCULOSKELETAL: Right knee:  ROM 0-110.  No ligamentous laxity.  Medial joint line tenderness.  + small effusion.  NVI.  Assessment: LOOSE BODY IN RIGHT KNEE JOINT  Plan: Plan for Procedure(s): ARTHROSCOPY-RIGHT KNEE, REMOVAL OF LOOSE BODY  I have discussed the details of the operation and the post-op course with the patient and her family.  A pre-op H&P was performed at the bedside in the pre-op area.    I discussed the risks and benefits of surgery. The risks include but are not limited to infection, bleeding, nerve or blood vessel injury, joint stiffness or loss of motion, persistent pain, weakness or instability and hardware failure and the need for further surgery. Medical risks include but are not limited to DVT and pulmonary embolism, myocardial infarction, stroke, pneumonia, respiratory failure and death. Patient understood these risks and wished to proceed.    Juanell FairlyKevin Mclain Freer, MD   03/12/2018 11:05 AM

## 2018-03-13 ENCOUNTER — Encounter: Payer: Self-pay | Admitting: Orthopedic Surgery

## 2018-05-04 ENCOUNTER — Encounter: Payer: Self-pay | Admitting: Adult Health

## 2018-05-04 ENCOUNTER — Other Ambulatory Visit: Payer: BC Managed Care – PPO | Admitting: Adult Health

## 2018-05-04 DIAGNOSIS — B373 Candidiasis of vulva and vagina: Secondary | ICD-10-CM

## 2018-05-04 DIAGNOSIS — B3731 Acute candidiasis of vulva and vagina: Secondary | ICD-10-CM

## 2018-05-04 MED ORDER — FLUCONAZOLE 150 MG PO TABS: 150.0000 mg | ORAL_TABLET | Freq: Every day | ORAL | 0 refills | Status: DC

## 2018-05-04 NOTE — Progress Notes (Signed)
Virtual Visit via Telephone Note  I connected with Adriana Carr on 05/04/18 at  by telephone and verified that I am speaking with the correct person using two identifiers.   I discussed the limitations, risks, security and privacy concerns of performing an evaluation and management service by telephone and the availability of in person appointments. I also discussed with the patient that there may be a patient responsible charge related to this service. The patient expressed understanding and agreed to proceed.  History of Present Illness:  33 y.o. AA female called to report "yeast infection"; she reports she started noting white cottage cheese -like vaginal discharge, moderate amount, increasing quantity x 2 days.  No odor. Denies perineal itching/burning. Denies dysuria, frequency, urgency, changes in urine character, abdominal pain, fever/chills.   She reports hx of yeast infections, this feels typical, has tried OTC topicals in the past but haven't been effective. She has taken diflucan in the past.   She is sexually active but not recently active, Was just tested for STDs last month by GYN and reportedly negative. Not sexually active since that time.   Reviewed allergies, meds, PMH.     Observations/Objective:  Well sounding female in no apparent distress; no cough; speaks in complete sentences. A&O, Insight and Judgment appropriate.    Assessment and Plan:  Diagnoses and all orders for this visit:  Vaginal yeast infection Presumptive dx based on history; recent neg STD screen without sexual activity following, typical vaginal yeast symptoms that she has had previously  Treatment: abstain from coitus during course of treatment and wear 100 % cotton underwear  ROV prn if symptoms persist or worsen. -     fluconazole (DIFLUCAN) 150 MG tablet; Take 1 tablet (150 mg total) by mouth daily. Take 1 tab by mouth once; repeat in 1 week if needed for yeast infection.   Follow Up  Instructions:    I discussed the assessment and treatment plan with the patient. The patient was provided an opportunity to ask questions and all were answered. The patient agreed with the plan and demonstrated an understanding of the instructions.   The patient was advised to call back or seek an in-person evaluation if the symptoms worsen or if the condition fails to improve as anticipated.  I provided 15 minutes of non-face-to-face time during this encounter.   Dan Maker, NP   Future Appointments  Date Time Provider Department Center  06/04/2018  4:00 PM [name], NP [facility] None  02/24/2019  2:00 PM [name], NP [facility] None

## 2018-06-03 NOTE — Progress Notes (Signed)
FOLLOW UP  Assessment and Plan:   Emmalynne was seen today for follow-up.  Diagnoses and all orders for this visit:  Body mass index (BMI) of 40.0 to 44.9 in adult [facility]) Long discussion about weight loss, diet, and exercise Recommended diet heavy in fruits and veggies and low in animal meats, cheeses, and dairy products, appropriate calorie intake Discussed appropriate weight for height  Will restart phentermine, reminded to take regular breaks Reviewed caloric intake and macros; she is likely cutting too much; discussed not intermittent fasting more than 2 days per week, always follow up a very low calorie day with a much higher calorie day, should aim to average 1300-1500 calories per day Aim for 40:30:30 macro split Discussed sources of protein and fat Follow up in 3 months -     phentermine (ADIPEX-P) 37.5 MG tablet; Take 1 tablet (37.5 mg total) by mouth daily before breakfast. Take 1/2-1 tab as needed daily.  Low vitamin D level Continue with 5000 units supplement daily for goal of 60-100 Check vit D level at CPE  Essential hypertension Monitor blood pressure at home; call if consistently over 130/80 Continue DASH diet.   Reminder to go to the ER if any CP, SOB, nausea, dizziness, severe HA, changes vision/speech, left arm numbness and tingling and jaw pain. -     triamterene-hydrochlorothiazide (MAXZIDE) 75-50 MG tablet; Take 1 tablet by mouth daily.  Fatigue/B12 deficiency She has not noted improvement in energy levels with oral tabs or sublingual B12 Reports historically has required injections and would like to resume this - initiate 1000 mg injection q30 days   Continue diet and meds as discussed.  Discussed med's effects and SE's.   Over 30 minutes of exam, counseling, chart review, and critical decision making was performed.   Future Appointments  Date Time Provider Department Center  06/04/2018 11:45 AM [name], NP [facility] None  09/03/2018 11:30 AM [name], NP [facility] None  02/24/2019  2:00 PM [name], NP [facility] None    ----------------------------------------------------------------------------------------------------------------------  HPI 33 y.o. female  presents for 3 month follow up on morbid obesity, hypertension/edema, fatigue/B12 deficiency and vitamin D deficiency.   She recently underwent arthroscopic knee surgery and ankle reconstruction surgery by Dr. Martha Clan.  she is prescribed phentermine for weight loss, hasn't been taking regularly due to recent surgery.  While on the medication they have lost 0 lbs since last visit. They deny palpitations, anxiety, trouble sleeping, elevated BP.   BMI is Body mass index is 41.25 kg/m., she is working on diet, exercise limited due to recent surgery. She is meal prepping.  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 She does aggressive intermittent fasting 20:4 a few days a week   Today their BP is  BP: 110/76  She denies chest pain, shortness of breath, dizziness.  Patient has started on Vitamin D supplement since last visit at which vitamin D was noted to be very low:    Lab Results  Component Value Date   VD25OH 2527 (L) 02/23/2018     She has hx of b12  deficiency, has had injections in past, recently reporting fatigue, not improved since starting oral B12 supplement Lab Results  Component Value Date   VITAMINB12 379 02/23/2018    She reports ongoing fatigue;  reports she wakes up feeling refreshed more than 1/2 the time, estimates 6-7 hours of sleep most nights. Tries to fall asleep around 11 pm, but admits very irregular sleep times. Typically wakes up around 7 am.   Blood counts, thyroid functions and iron levels were normal:  Lab Results  Component Value Date   WBC 8.1 02/23/2018   HGB 13.5 02/23/2018   HCT 40.2 02/23/2018   MCV 82.9 02/23/2018   PLT 487 (H) 02/23/2018   Lab Results  Component Value Date   TSH 1.28 02/23/2018   Lab Results  Component Value Date   IRON 49 11/21/2016   TIBC 341 11/21/2016   FERRITIN 32 11/21/2016      Current Medications:  Current Outpatient Medications on File Prior to Visit  Medication Sig  . Cholecalciferol (VITAMIN D3) 10000 units TABS Take 1 tablet by mouth daily. WHEN SHE REMEMBERS  . meloxicam (MOBIC) 15 MG tablet Take 15 mg by mouth daily as needed for pain.  . Multiple Vitamin (MULTIVITAMIN WITH MINERALS) TABS tablet Take 1 tablet by mouth daily.  . phentermine (ADIPEX-P) 37.5 MG tablet Take 1/2 to 1 tablet every morning for dieting & weightloss (Patient taking differently: Take 37.5 mg by mouth daily before breakfast. Take 1/2 to 1 tablet every morning for dieting & weightloss)  . triamterene-hydrochlorothiazide (MAXZIDE) 75-50 MG tablet Take 1 tablet by mouth daily as needed (fluid).  Marland Kitchen. aspirin EC 325 MG tablet Take 1 tablet (325 mg total) by mouth daily. (Patient not taking: Reported on 03/11/2018)  . fluconazole (DIFLUCAN) 150 MG tablet Take 1 tablet (150 mg total) by mouth daily. Take 1 tab by mouth once; repeat in 1 week if needed for yeast infection.  Marland Kitchen. HYDROcodone-acetaminophen (NORCO) 5-325 MG tablet Take 1 tablet by mouth every 4 (four) hours as needed for moderate pain.  (Patient not taking: Reported on 06/04/2018)  . ondansetron (ZOFRAN) 4 MG tablet Take 1 tablet (4 mg total) by mouth every 8 (eight) hours as needed for nausea or vomiting. (Patient not taking: Reported on 02/23/2018)   No current facility-administered medications on file prior to visit.      Allergies: No Known Allergies   Medical History:  Past Medical History:  Diagnosis Date  . Allergy    seasonal  . Fluid retention in legs    takes maxide prn   Family history- Reviewed and unchanged Social history- Reviewed and unchanged   Review of Systems:  Review of Systems  Constitutional: Positive for malaise/fatigue (intermittent). Negative for weight loss.  HENT: Negative for hearing loss and tinnitus.   Eyes: Negative for blurred vision and double vision.  Respiratory: Negative for cough, sputum production, shortness of breath and wheezing.   Cardiovascular: Negative for chest pain, palpitations, orthopnea, claudication and leg swelling.  Gastrointestinal: Negative for abdominal pain, blood in stool, constipation, diarrhea, heartburn, melena, nausea and vomiting.  Genitourinary: Negative.   Musculoskeletal: Negative for joint pain and myalgias.  Skin: Negative  for rash.  Neurological: Negative for dizziness, tingling, sensory change, weakness and headaches.  Endo/Heme/Allergies: Negative for environmental allergies and polydipsia.  Psychiatric/Behavioral: Negative.  Negative for depression and substance abuse. The patient is not nervous/anxious and does not have insomnia.   All other systems reviewed and are negative.   Physical Exam: BP 110/76   Pulse 88   Temp (!) 97.3 F (36.3 C)   Ht 5' 2.75" (1.594 m)   Wt 231 lb (104.8 kg)   SpO2 99%   BMI 41.25 kg/m  Wt Readings from Last 3 Encounters:  06/04/18 231 lb (104.8 kg)  03/12/18 231 lb 1 oz (104.8 kg)  02/23/18 224 lb 6.4 oz (101.8 kg)   General Appearance: Well nourished, in no apparent distress. Eyes: PERRLA, EOMs,  conjunctiva no swelling or erythema Sinuses: No Frontal/maxillary tenderness ENT/Mouth: Ext aud canals clear, TMs without erythema, bulging. No erythema, swelling, or exudate on post pharynx.  Tonsils not swollen or erythematous. Hearing normal.  Neck: Supple, thyroid normal.  Respiratory: Respiratory effort normal, BS equal bilaterally without rales, rhonchi, wheezing or stridor.  Cardio: RRR with no MRGs. Brisk peripheral pulses without edema.  Abdomen: Soft, + BS.  Non tender, no guarding, rebound, hernias, masses. Lymphatics: Non tender without lymphadenopathy.  Musculoskeletal: Full ROM, 5/5 strength, Normal gait Skin: Warm, dry without rashes, ecchymosis.  Neuro: Cranial nerves intact. No cerebellar symptoms.  Psych: Awake and oriented X 3, normal affect, Insight and Judgment appropriate.    Dan Maker, NP 10:17 AM [facility]

## 2018-06-04 ENCOUNTER — Ambulatory Visit: Payer: Self-pay | Admitting: Adult Health

## 2018-06-04 ENCOUNTER — Encounter: Payer: Self-pay | Admitting: Adult Health

## 2018-06-04 ENCOUNTER — Other Ambulatory Visit: Payer: Self-pay

## 2018-06-04 ENCOUNTER — Ambulatory Visit (INDEPENDENT_AMBULATORY_CARE_PROVIDER_SITE_OTHER): Payer: BC Managed Care – PPO | Admitting: Adult Health

## 2018-06-04 VITALS — BP 110/76 | HR 88 | Temp 97.3°F | Ht 62.75 in | Wt 231.0 lb

## 2018-06-04 DIAGNOSIS — R7989 Other specified abnormal findings of blood chemistry: Secondary | ICD-10-CM

## 2018-06-04 DIAGNOSIS — I10 Essential (primary) hypertension: Secondary | ICD-10-CM | POA: Diagnosis not present

## 2018-06-04 DIAGNOSIS — E538 Deficiency of other specified B group vitamins: Secondary | ICD-10-CM

## 2018-06-04 DIAGNOSIS — Z6841 Body Mass Index (BMI) 40.0 and over, adult: Secondary | ICD-10-CM | POA: Diagnosis not present

## 2018-06-04 DIAGNOSIS — R5383 Other fatigue: Secondary | ICD-10-CM

## 2018-06-04 MED ORDER — CYANOCOBALAMIN 1000 MCG/ML IJ SOLN: 1000.0000 ug | INTRAMUSCULAR | 2 refills | Status: DC

## 2018-06-04 NOTE — Patient Instructions (Signed)
Goals    . DIET - EAT MORE FRUITS AND VEGETABLES     7+ servings daily    . Exercise 150 min/wk Moderate Activity    . Weight (lb) < 210 lb (95.3 kg)      Do not do aggressive fasting more than 2 days a week  Follow up with a higher calorie day (up to 1800)   Minimum of 1300-1500 calories most days   Focus on protein and fat intake (avocado, nuts, seeds, salmon)  Add HIIT exercise and resistance or weights as tolerated   WILL gain some weight initially - that is Ok - goal is to boost metabolism       Drink 1/2 your body weight in fluid ounces of water daily; drink a tall glass of water 30 min before meals  Don't eat until you're stuffed- listen to your stomach and eat until you are 80% full   Try eating off of a salad plate; wait 10 min after finishing before going back for seconds  Start by eating the vegetables on your plate; aim for 33% of your meals to be fruits or vegetables  Then eat your protein - lean meats (grass fed if possible), fish, beans, nuts in moderation  Eat your carbs/starch last ONLY if you still are hungry. If you can, stop before finishing it all  Avoid sugar and flour - the closer it looks to it's original form in nature, typically the better it is for you  Splurge in moderation - "assign" days when you get to splurge and have the "bad stuff" - I like to follow a 80% - 20% plan- "good" choices 80 % of the time, "bad" choices in moderation 20% of the time  Simple equation is: Calories out > calories in = weight loss - even if you eat the bad stuff, if you limit portions, you will still lose weight        When it comes to diets, agreement about the perfect plan isn't easy to find, even among the experts. Experts at the [facility] developed an idea known as the Healthy Eating Plate. Just imagine a plate divided into logical, healthy portions.  The emphasis is on diet quality:  Load up on vegetables and fruits - one-half  of your plate: Aim for color and variety, and remember that potatoes don't count.  Go for whole grains - one-quarter of your plate: Whole wheat, barley, wheat berries, quinoa, oats, brown rice, and foods made with them. If you want pasta, go with whole wheat pasta.  Protein power - one-quarter of your plate: Fish, chicken, beans, and nuts are all healthy, versatile protein sources. Limit red meat.  The diet, however, does go beyond the plate, offering a few other suggestions.  Use healthy plant oils, such as olive, canola, soy, corn, sunflower and peanut. Check the labels, and avoid partially hydrogenated oil, which have unhealthy trans fats.  If you're thirsty, drink water. Coffee and tea are good in moderation, but skip sugary drinks and limit milk and dairy products to one or two daily servings.  The type of carbohydrate in the diet is more important than the amount. Some sources of carbohydrates, such as vegetables, fruits, whole grains, and beans-are healthier than others.  Finally, stay active.

## 2018-09-02 NOTE — Progress Notes (Signed)
FOLLOW UP  Assessment and Plan:   Adriana Carr was seen today for follow-up.  Diagnoses and all orders for this visit:  Body mass index (BMI) of 40.0 to 44.9 in adult [facility]) Long discussion about weight loss, diet, and exercise Recommended diet heavy in fruits and veggies and low in animal meats, cheeses, and dairy products, appropriate calorie intake Discussed appropriate weight for height  Will restart phentermine, reminded to take regular breaks Reviewed caloric intake and macros; she is likely cutting too much; discussed not intermittent fasting more than 2 days per week, always follow up a very low calorie day with a much higher calorie day, should aim to average 1300-1500 calories per day Aim for 40:30:30 macro split Discussed sources of protein and fat Follow up in 3 months -     phentermine (ADIPEX-P) 37.5 MG tablet; Take 1 tablet (37.5 mg total) by mouth daily before breakfast. Take 1/2-1 tab as needed daily.  Low vitamin D level She is taking 10000 units supplement daily for goal of 60-100 Check vit D level due to high dose supplement  Essential hypertension Monitor blood pressure at home; call if consistently over 130/80 Continue DASH diet.   Reminder to go to the ER if any CP, SOB, nausea, dizziness, severe HA, changes vision/speech, left arm numbness and tingling and jaw pain. -     triamterene-hydrochlorothiazide (MAXZIDE) 75-50 MG tablet; Take 1 tablet by mouth daily.  Fatigue/B12 deficiency She has not noted improvement in energy levels with oral tabs or sublingual B12 Reports historically has required injections and would like to resume this - initiate 1000 mg injection q30 days   Continue diet and meds as discussed.  Discussed med's effects and SE's.   Over 30 minutes of exam, counseling, chart review, and critical decision making was performed.   Future Appointments  Date Time Provider [place]  02/24/2019  2:00 PM Liane Comber, NP [facility] None     ----------------------------------------------------------------------------------------------------------------------  HPI 33 y.o. female  presents for 3 month follow up on morbid obesity, hypertension/edema, fatigue/B12 deficiency and vitamin D deficiency.   She recently underwent arthroscopic knee surgery and ankle reconstruction surgery by Dr. Mack Guise, discussing possible need for repeat scope but is postponing in lieu of PT. Takes mobic PRN.   she is prescribed phentermine for weight loss, hasn't been taking regularly due to recent surgery, then had to stop for oral procedure and was on prednisone which she reports caused some gain.  While on the medication they have lost 0 lbs since last visit. They deny palpitations, anxiety, trouble sleeping, elevated BP.    BMI is Body mass index is 41.43 kg/m., she is working on diet, exercise limited due to recent surgery. 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 She does aggressive intermittent fasting 20:4 a few days a week   Today their BP is BP: 110/78  She denies chest pain, shortness of breath, dizziness.  Patient has started on Vitamin D supplement, taking [id number] IU since last visit at which vitamin D was noted to be  very low:    Lab Results  Component Value Date   VD25OH 8227 (L) 02/23/2018     She has hx of b12 deficiency, has had injections in past, recently reporting fatigue, not improved since starting oral B12 supplement, wants to restart injections;  Lab Results  Component Value Date   VITAMINB12 379 02/23/2018     Current Medications:  Current Outpatient Medications on File Prior to Visit  Medication Sig  . Cholecalciferol (VITAMIN D3) 10000 units TABS Take 1 tablet by mouth daily. WHEN SHE REMEMBERS  . meloxicam (MOBIC) 15 MG tablet Take 15 mg by mouth daily as needed for pain.  . Multiple Vitamin (MULTIVITAMIN WITH MINERALS) TABS tablet Take 1 tablet by mouth daily.  Marland Kitchen. triamterene-hydrochlorothiazide (MAXZIDE) 75-50 MG tablet Take 1 tablet by mouth daily as needed (fluid).  . cyanocobalamin (,VITAMIN B-12,) 1000 MCG/ML injection Inject 1 mL (1,000 mcg total) into the skin every 30 (thirty) days. Bring with you to each visit to have injected by the nurse. (Patient not taking: Reported on 09/03/2018)  . phentermine (ADIPEX-P) 37.5 MG tablet Take 1/2 to 1 tablet every morning for dieting & weightloss (Patient not taking: Reported on 09/03/2018)   No current facility-administered medications on file prior to visit.      Allergies: No Known Allergies   Medical History:  Past Medical History:  Diagnosis Date  . Allergy    seasonal  . Fluid retention in legs    takes maxide prn   Family history- Reviewed and unchanged Social history- Reviewed and unchanged   Review of Systems:  Review of Systems  Constitutional: Positive for malaise/fatigue (intermittent). Negative for weight loss.  HENT: Negative for hearing loss and tinnitus.   Eyes: Negative for blurred vision and double vision.  Respiratory: Negative for cough, sputum production, shortness of breath and wheezing.   Cardiovascular: Negative for chest pain, palpitations, orthopnea, claudication and leg swelling.  Gastrointestinal:  Negative for abdominal pain, blood in stool, constipation, diarrhea, heartburn, melena, nausea and vomiting.  Genitourinary: Negative.   Musculoskeletal: Negative for joint pain and myalgias.  Skin: Negative for rash.  Neurological: Negative for dizziness, tingling, sensory change, weakness and headaches.  Endo/Heme/Allergies: Negative for environmental allergies and polydipsia.  Psychiatric/Behavioral: Negative.  Negative for depression and substance abuse. The patient is not nervous/anxious and does not have insomnia.   All other systems reviewed and are negative.   Physical Exam: BP 110/78   Pulse (!) 107   Temp (!) 97.3 F (36.3 C)   Wt 232 lb (105.2 kg)   LMP 08/31/2018 (Exact Date)   SpO2 98%   BMI 41.43 kg/m  Wt Readings from Last 3 Encounters:  09/03/18 232 lb (105.2 kg)  06/04/18 231 lb (104.8 kg)  03/12/18 231 lb 1 oz (104.8 kg)   General Appearance: Well nourished, in no apparent distress. Eyes: PERRLA, EOMs, conjunctiva no swelling or erythema Sinuses: No Frontal/maxillary tenderness ENT/Mouth: Ext aud canals clear, TMs without erythema, bulging. No erythema, swelling, or exudate on post pharynx.  Tonsils not swollen or erythematous. Hearing normal.  Neck: Supple, thyroid normal.  Respiratory: Respiratory effort normal, BS equal bilaterally without rales, rhonchi, wheezing or stridor.  Cardio: RRR with  no MRGs. Brisk peripheral pulses without edema.  Abdomen: Soft, + BS.  Non tender, no guarding, rebound, hernias, masses. Lymphatics: Non tender without lymphadenopathy.  Musculoskeletal: Full ROM, 5/5 strength, Normal gait Skin: Warm, dry without rashes, ecchymosis.  Neuro: Cranial nerves intact. No cerebellar symptoms.  Psych: Awake and oriented X 3, normal affect, Insight and Judgment appropriate.    Dan MakerAshley C Laconda Basich, NP 11:52 AM [facility]

## 2018-09-03 ENCOUNTER — Encounter: Payer: Self-pay | Admitting: Adult Health

## 2018-09-03 ENCOUNTER — Other Ambulatory Visit: Payer: Self-pay

## 2018-09-03 ENCOUNTER — Ambulatory Visit: Payer: BC Managed Care – PPO | Admitting: Adult Health

## 2018-09-03 VITALS — BP 110/78 | HR 107 | Temp 97.3°F | Wt 232.0 lb

## 2018-09-03 DIAGNOSIS — Z6841 Body Mass Index (BMI) 40.0 and over, adult: Secondary | ICD-10-CM | POA: Diagnosis not present

## 2018-09-03 DIAGNOSIS — I10 Essential (primary) hypertension: Secondary | ICD-10-CM

## 2018-09-03 DIAGNOSIS — R7989 Other specified abnormal findings of blood chemistry: Secondary | ICD-10-CM

## 2018-09-03 DIAGNOSIS — E538 Deficiency of other specified B group vitamins: Secondary | ICD-10-CM | POA: Diagnosis not present

## 2018-09-03 MED ORDER — CYANOCOBALAMIN 1000 MCG/ML IJ SOLN: 1000.0000 ug | INTRAMUSCULAR | 2 refills | Status: DC

## 2018-09-03 MED ORDER — PHENTERMINE HCL 37.5 MG PO TABS: ORAL_TABLET | ORAL | 2 refills | Status: DC

## 2018-09-03 NOTE — Patient Instructions (Addendum)
Goals    . DIET - EAT MORE FRUITS AND VEGETABLES     7+ servings daily    . Exercise 150 min/wk Moderate Activity    . Weight (lb) < 210 lb (95.3 kg)     Weigh weekly and keep a log        Suggest tracking your calories and macros more consistently to make sure you aren't starving your body - this is INFORMATION for you to achieve success and help you identify patterns and habits  Do not do aggressive fasting more than 2-3 days a week Follow up a low calorie day with a higher calorie day Minimum of 1200 calories most days, needs to average out over the week to no less than this  Focus on protein (lean proteins, nuts, beans) and fat intake (avocado, nuts, seeds, salmon) Add HIIT (high intensity interval training) exercise and resistance or weights as tolerated - this helps build muscle mass which will increase basal metabolic rate  WILL gain some weight initially - that is Ok - goal is to boost Metabolism - want to feed your metabolism and body rather than starving it      Drink 1/2 your body weight in fluid ounces of water daily; drink a tall glass of water 30 min before meals  Don't eat until you're stuffed- listen to your stomach and eat until you are 80% full   Try eating off of a salad plate; wait 10 min after finishing before going back for seconds  Start by eating the vegetables on your plate; aim for 50% of your meals to be fruits or vegetables  Then eat your protein - lean meats (grass fed if possible), fish, beans, nuts in moderation  Eat your carbs/starch last ONLY if you still are hungry. If you can, stop before finishing it all  Avoid sugar and flour - the closer it looks to it's original form in nature, typically the better it is for you  Splurge in moderation - "assign" days when you get to splurge and have the "bad stuff" - I like to follow a 80% - 20% plan- "good" choices 80 % of the time, "bad" choices in moderation 20% of the time  Simple equation is:  Calories out > calories in = weight loss - even if you eat the bad stuff, if you limit portions, you will still lose weight       When it comes to diets, agreement about the perfect plan isn't easy to find, even among the experts. Experts at the [employer] developed an idea known as the Healthy Eating Plate. Just imagine a plate divided into logical, healthy portions.  The emphasis is on diet quality:  Load up on vegetables and fruits - one-half of your plate: Aim for color and variety, and remember that potatoes don't count.  Go for whole grains - one-quarter of your plate: Whole wheat, barley, wheat berries, quinoa, oats, brown rice, and foods made with them. If you want pasta, go with whole wheat pasta.  Protein power - one-quarter of your plate: Fish, chicken, beans, and nuts are all healthy, versatile protein sources. Limit red meat.  The diet, however, does go beyond the plate, offering a few other suggestions.  Use healthy plant oils, such as olive, canola, soy, corn, sunflower and peanut. Check the labels, and avoid partially hydrogenated oil, which have unhealthy trans fats.  If you're thirsty, drink water. Coffee and tea are good in moderation, but  skip sugary drinks and limit milk and dairy products to one or two daily servings.  The type of carbohydrate in the diet is more important than the amount. Some sources of carbohydrates, such as vegetables, fruits, whole grains, and beans-are healthier than others.  Finally, stay active.

## 2018-09-04 LAB — COMPLETE METABOLIC PANEL WITH GFR
AG Ratio: 1.6 (calc) (ref 1.0–2.5)
ALT: 12 U/L (ref 6–29)
AST: 12 U/L (ref 10–30)
Albumin: 5 g/dL (ref 3.6–5.1)
Alkaline phosphatase (APISO): 88 U/L (ref 31–125)
BUN: 9 mg/dL (ref 7–25)
CO2: 26 mmol/L (ref 20–32)
Calcium: 10.8 mg/dL — ABNORMAL HIGH (ref 8.6–10.2)
Chloride: 100 mmol/L (ref 98–110)
Creat: 0.83 mg/dL (ref 0.50–1.10)
GFR, Est African American: 106 mL/min/{1.73_m2} (ref >=60)
GFR, Est Non African American: 91 mL/min/{1.73_m2} (ref >=60)
Globulin: 3.2 g/dL (calc) (ref 1.9–3.7)
Glucose, Bld: 100 mg/dL — ABNORMAL HIGH (ref 65–99)
Potassium: 4.3 mmol/L (ref 3.5–5.3)
Sodium: 137 mmol/L (ref 135–146)
Total Bilirubin: 0.2 mg/dL (ref 0.2–1.2)
Total Protein: 8.2 g/dL — ABNORMAL HIGH (ref 6.1–8.1)

## 2018-09-04 LAB — VITAMIN D 25 HYDROXY (VIT D DEFICIENCY, FRACTURES): Vit D, 25-Hydroxy: 26 ng/mL — ABNORMAL LOW (ref 30–100)

## 2018-12-02 NOTE — Progress Notes (Deleted)
FOLLOW UP  Assessment and Plan:   Adriana Carr was seen today for follow-up.  Diagnoses and all orders for this visit:  Body mass index (BMI) of 40.0 to 44.9 in adult [facility]) Long discussion about weight loss, diet, and exercise Recommended diet heavy in fruits and veggies and low in animal meats, cheeses, and dairy products, appropriate calorie intake Discussed appropriate weight for height  Will restart phentermine, reminded to take regular breaks Reviewed caloric intake and macros; she is likely cutting too much; discussed not intermittent fasting more than 2 days per week, always follow up a very low calorie day with a much higher calorie day, should aim to average 1300-1500 calories per day Aim for 40:30:30 macro split Discussed sources of protein and fat Follow up in 3 months -     phentermine (ADIPEX-P) 37.5 MG tablet; Take 1 tablet (37.5 mg total) by mouth daily before breakfast. Take 1/2-1 tab as needed daily.  Low vitamin D level She is taking [id number] units supplement daily for goal of 60-100 Check vit D level due to high dose supplement  Essential hypertension Monitor blood pressure at home; call if consistently over 130/80 Continue DASH diet.   Reminder to go to the ER if any CP, SOB, nausea, dizziness, severe HA, changes vision/speech, left arm numbness and tingling and jaw pain. -     triamterene-hydrochlorothiazide (MAXZIDE) 75-50 MG tablet; Take 1 tablet by mouth daily.  Fatigue/B12 deficiency She has not noted improvement in energy levels with oral tabs or sublingual B12 Reports historically has required injections and would like to resume this - initiate 1000 mg injection q30 days   Continue diet and meds as discussed.  Discussed med's effects and SE's.   Over 30 minutes of exam, counseling, chart review, and critical decision making was performed.   Future Appointments  Date Time Provider Department Center  12/03/2018  4:00 PM Judd Gaudierorbett, Stefany Starace, NP [facility] None   02/24/2019  2:00 PM Judd Gaudierorbett, Jonus Coble, NP [facility] None    ----------------------------------------------------------------------------------------------------------------------  HPI 33 y.o. female  presents for 3 month follow up on morbid obesity, hypertension/edema, fatigue/B12 deficiency and vitamin D deficiency.   This year she underwent arthroscopic knee surgery and ankle reconstruction surgery by Dr. Martha ClanKrasinski, discussing possible need for repeat scope but is postponing in lieu of PT. Takes mobic PRN.   she is prescribed phentermine for weight loss, hasn't been taking regularly due to recent surgery, then had to stop for oral procedure and was on prednisone which she reports caused some gain.  While on the medication they have lost 0 lbs since last visit. They deny palpitations, anxiety, trouble sleeping, elevated BP.    BMI is There is no height or weight on file to calculate BMI., she is working on diet, exercise limited due to recent surgery. 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 She does aggressive intermittent fasting 20:4 a few days a week  Reviewed caloric intake and macros; she is likely cutting too much; discussed not intermittent fasting more than 2 days per  week, always follow up a very low calorie day with a much higher calorie day, should aim to average 1300-1500 calories per day Aim for 40:30:30 macro split    Today their BP is    She denies chest pain, shortness of breath, dizziness.  Patient has started on Vitamin D supplement, taking 10000 IU since last visit at which vitamin D was noted to be very low:    Lab Results  Component Value Date   VD25OH 88 (L) 09/03/2018     She has hx of b12 deficiency, has had injections in past, recently reporting fatigue, not improved since starting oral B12 supplement, was restarted on injections **** Lab Results  Component Value Date   [id number] 622 02/23/2018     Current Medications:  Current Outpatient Medications on File Prior to Visit  Medication Sig  . Cholecalciferol (VITAMIN D3) 10000 units TABS Take 1 tablet by mouth daily. WHEN SHE REMEMBERS  . cyanocobalamin (,VITAMIN B-12,) 1000 MCG/ML injection Inject 1 mL (1,000 mcg total) into the skin every 30 (thirty) days. Bring with you to each visit to have injected by the nurse.  . meloxicam (MOBIC) 15 MG tablet Take 15 mg by mouth daily as needed for pain.  . Multiple Vitamin (MULTIVITAMIN WITH MINERALS) TABS tablet Take 1 tablet by mouth daily.  . phentermine (ADIPEX-P) 37.5 MG tablet Take 1/2 to 1 tablet every morning for dieting & weightloss  . triamterene-hydrochlorothiazide (MAXZIDE) 75-50 MG tablet Take 1 tablet by mouth daily as needed (fluid).   No current facility-administered medications on file prior to visit.      Allergies: No Known Allergies   Medical History:  Past Medical History:  Diagnosis Date  . Allergy    seasonal  . Fluid retention in legs    takes maxide prn   Family history- Reviewed and unchanged Social history- Reviewed and unchanged   Review of Systems:  Review of Systems  Constitutional: Positive for malaise/fatigue (intermittent). Negative for weight loss.  HENT: Negative for hearing loss and  tinnitus.   Eyes: Negative for blurred vision and double vision.  Respiratory: Negative for cough, sputum production, shortness of breath and wheezing.   Cardiovascular: Negative for chest pain, palpitations, orthopnea, claudication and leg swelling.  Gastrointestinal: Negative for abdominal pain, blood in stool, constipation, diarrhea, heartburn, melena, nausea and vomiting.  Genitourinary: Negative.   Musculoskeletal: Negative for joint pain and myalgias.  Skin: Negative for rash.  Neurological: Negative for dizziness, tingling, sensory change, weakness and headaches.  Endo/Heme/Allergies: Negative for environmental allergies and polydipsia.  Psychiatric/Behavioral: Negative.  Negative for depression and substance abuse. The patient is not nervous/anxious and does not have insomnia.   All other systems reviewed and are negative.   Physical Exam: There were no vitals taken for this visit. Wt Readings from Last 3 Encounters:  09/03/18 232 lb (105.2 kg)  06/04/18 231 lb (104.8 kg)  03/12/18 231 lb 1 oz (104.8 kg)   General Appearance: Well nourished, in no apparent distress. Eyes: PERRLA, EOMs, conjunctiva no swelling or erythema Sinuses: No Frontal/maxillary tenderness ENT/Mouth: Ext aud canals clear, TMs without erythema, bulging. No erythema, swelling, or exudate on post pharynx.  Tonsils not swollen or erythematous. Hearing  normal.  Neck: Supple, thyroid normal.  Respiratory: Respiratory effort normal, BS equal bilaterally without rales, rhonchi, wheezing or stridor.  Cardio: RRR with no MRGs. Brisk peripheral pulses without edema.  Abdomen: Soft, + BS.  Non tender, no guarding, rebound, hernias, masses. Lymphatics: Non tender without lymphadenopathy.  Musculoskeletal: Full ROM, 5/5 strength, Normal gait Skin: Warm, dry without rashes, ecchymosis.  Neuro: Cranial nerves intact. No cerebellar symptoms.  Psych: Awake and oriented X 3, normal affect, Insight and Judgment appropriate.     Adriana Maker, NP 1:58 PM [facility]

## 2018-12-03 ENCOUNTER — Ambulatory Visit: Payer: BC Managed Care – PPO | Admitting: Adult Health

## 2018-12-03 DIAGNOSIS — I10 Essential (primary) hypertension: Secondary | ICD-10-CM

## 2018-12-28 ENCOUNTER — Encounter: Payer: Self-pay | Admitting: Adult Health

## 2019-02-22 NOTE — Progress Notes (Deleted)
Complete Physical  Assessment and Plan: Health Maintenance- Discussed STD testing, safe sex, alcohol and drug awareness, drinking and driving dangers, wearing a seat belt and general safety measures for young adult.  Diagnoses and all orders for this visit:  Encounter for routine adult health examination without abnormal findings - referred back to GYN per patient request ***  Essential hypertension Monitor blood pressure at home; call if consistently over 130/80 Continue DASH diet.   Reminder to go to the ER if any CP, SOB, nausea, dizziness, severe HA, changes vision/speech, left arm numbness and tingling and jaw pain. -     CBC with Differential/Platelet -     COMPLETE METABOLIC PANEL WITH GFR -     Magnesium -     Urinalysis, Routine w reflex microscopic -     EKG 12-Lead  Eczema, unspecified type      -      Minor, currently managed without prescriptions  Low vitamin D level -     VITAMIN D 25 Hydroxy (Vit-D Deficiency, Fractures)  Body mass index (BMI) of 40.0 to 44.9 in adult [facility]) Long discussion about weight loss, diet, and exercise Discussed final goal weight  Patient on phentermine with benefit and no SE, will restart, taking drug breaks; continue close follow up. Return in 3 months   Screening cholesterol level -     Lipid panel  Screening for thyroid disorder -     TSH  Screening for diabetes mellitus -     Hemoglobin A1c  Thrombocytosis (HCC) Pursue peripheral smear if persistent, check deficiencies -     CBC with Differential/Platelet -     Iron  -     Vitamin B12  B12 deficiency ***  Discussed med's effects and SE's. Screening labs and tests as requested with regular follow-up as recommended. Over 40 minutes of exam, counseling, chart review and critical decision making was performed  Future Appointments  Date Time Provider [place]  02/24/2019  2:00 PM Liane Comber, NP [facility] None  02/24/2020  2:00 PM Liane Comber, NP [facility]  None     HPI  This very nice single AA 34 y.o.female presents for a complete physical. 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 She has Eczema; Body mass index (BMI) of 40.0 to 44.9 in adult [facility]); Vitamin D deficiency; Essential hypertension; B12 deficiency; and Fatigue on their problem list. 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   He endorses ongoing fatigue, was previously on B12 shots and reports this helped, was sent in but never initiated ***  M ild eczema ongoing from childhood that she treats with OTC agents  She was referred to GYN last year ***  This year she had ORIF of R ankle on 11/14/2017 by Dr. Mack Guise for displaced bimalleollar right ankle fracture after slipping at cheerleading practice. She reports she will also be having R knee scope in the next few weeks pending approval by insurance due to ligament sprain and lose cartilage fragment causing pain. She continues with PT twice weekly in the interim.  BMI is There is no height or weight on file to calculate BMI., she is working on diet and exercise. She has  previously reported a 22 lb weight loss with phentermine but is currently off of this medication.  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 Denies CP, SOB.   Her blood pressure has been controlled at home, today their BP is    She does workout. She denies chest pain, shortness of breath, dizziness.   She is not on cholesterol medication and denies myalgias. Her cholesterol is at goal. The cholesterol last visit was:   Lab  Results  Component Value Date   CHOL 159 02/23/2018   HDL 48 (L) 02/23/2018   LDLCALC 94 02/23/2018   TRIG 83 02/23/2018   CHOLHDL 3.3 02/23/2018    She has been working on diet and exercise for glucose management, and denies increased appetite, nausea, paresthesia of the feet, polydipsia, polyuria, visual disturbances and vomiting. Last A1C in the office was:  Lab Results  Component Value Date   HGBA1C 5.5 02/23/2018   Patient is on Vitamin D supplement, taking 07371 IU daily but admits to taking irregularly  Lab Results  Component Value Date   VD25OH 26 (L) 09/03/2018     Lab Results  Component Value Date   GFRAA 106 09/03/2018     Lab Results  Component Value Date   WBC 8.1 02/23/2018   HGB 13.5 02/23/2018   HCT 40.2 02/23/2018   MCV 82.9 02/23/2018   PLT 487 (H) 02/23/2018   Lab Results  Component Value Date   IRON 49 11/21/2016   TIBC 341 11/21/2016   FERRITIN 32 11/21/2016   She has B12 deficiency, poorly responsive to oral/sublingual supplements *** Lab Results  Component Value Date   VITAMINB12 379 02/23/2018     Current Medications:  Current Outpatient Medications on File Prior to Visit  Medication Sig Dispense Refill  . Cholecalciferol (VITAMIN D3) 10000 units TABS Take 1 tablet by mouth daily. WHEN SHE REMEMBERS    . cyanocobalamin (,VITAMIN B-12,) 1000 MCG/ML injection Inject 1 mL (1,000 mcg total) into the skin every 30 (thirty) days. Bring with you to each visit to have injected by the nurse. 30 mL 2  . meloxicam (MOBIC) 15 MG tablet Take 15 mg by mouth daily as needed for pain.    . Multiple Vitamin (MULTIVITAMIN WITH MINERALS) TABS tablet Take 1 tablet by mouth daily.    . phentermine (ADIPEX-P) 37.5 MG tablet Take 1/2 to 1 tablet every morning for dieting & weightloss 30 tablet 2  . triamterene-hydrochlorothiazide (MAXZIDE) 75-50 MG tablet Take 1 tablet by mouth daily as needed (fluid). 90 tablet 1   No current facility-administered medications  on file prior to visit.   Health Maintenance:   Immunization History  Administered Date(s) Administered  . PPD Test 11/21/2016  . Td 02/05/2012    TD/TDAP: 2015 Influenza: Declines Pneumovax: n/a Prevnar 13: n/a HPV vaccines: n/a  LMP: No LMP recorded.  Sexually Active: yes, STD screening declined today will get at GYN Pap: 2017 in [place] - never abnormal *** MGM: Bilateral diagnostic 2013 related to L breast lump  Last eye exam:  Last dental exam:   Allergies: No Known Allergies Medical History:  has Eczema; Body mass index (BMI) of 40.0 to 44.9 in adult [facility]); Vitamin D deficiency; Essential hypertension; B12 deficiency; and Fatigue on their problem list. Surgical History:  She  has a past surgical history that includes Cyst removal trunk (lower back); Pilonidal cyst excision (2003); ORIF ankle fracture (Right, 11/14/2017); Breast biopsy (Left, 2010); and Knee arthroscopy (Right, 03/12/2018). Family History:  Her She was adopted. Family history is unknown by patient. Social History:   reports that she quit smoking about 16 years ago. Her smoking use included cigarettes. She smoked 0.50 packs per day. She has never used smokeless tobacco. She reports current alcohol use. She reports that she does not use drugs.  Review of Systems: Review of Systems  Constitutional: Negative for malaise/fatigue and weight loss.  HENT: Negative for congestion, hearing loss, nosebleeds and tinnitus.   Eyes: Negative for blurred vision.  Respiratory: Negative for cough, shortness of breath and wheezing.   Cardiovascular: Negative for chest pain, palpitations and leg swelling.  Gastrointestinal: Negative for abdominal pain, blood in stool, constipation, diarrhea, heartburn, melena, nausea and vomiting.  Genitourinary: Negative.   Musculoskeletal: Positive for joint pain (R knee pain intermittently ). Negative for myalgias.  Skin: Negative.  Negative for rash.  Neurological: Negative for  dizziness, sensory change and headaches.  Endo/Heme/Allergies: Positive for environmental allergies. Negative for polydipsia. Does not bruise/bleed easily.  Psychiatric/Behavioral: Negative.  Negative for depression and substance abuse. The patient is not nervous/anxious and does not have insomnia.     Physical Exam: Estimated body mass index is 41.43 kg/m as calculated from the following:   Height as of 06/04/18: 5' 2.75" (1.594 m).   Weight as of 09/03/18: 232 lb (105.2 kg). There were no vitals taken for this visit. General Appearance: Well nourished, obese female in no apparent distress.  Eyes: PERRLA, EOMs, conjunctiva no swelling or erythema, normal fundi and vessels.  Sinuses: No Frontal/maxillary tenderness  ENT/Mouth: Ext aud canals clear, normal light reflex with TMs without erythema, bulging. Good dentition. No erythema, swelling, or exudate on post pharynx. Tonsils not swollen or erythematous. Hearing normal.  Neck: Supple, thyroid normal. No bruits  Respiratory: Respiratory effort normal, BS equal bilaterally without rales, rhonchi, wheezing or stridor.  Cardio: RRR without murmurs, rubs or gallops. Brisk peripheral pulses without edema.  Chest: symmetric, with normal excursions and percussion.  Breasts: Defer to GYN Abdomen: Soft, nontender, no guarding, rebound, hernias, masses, or organomegaly.  Lymphatics: Non tender without lymphadenopathy.  Genitourinary: Defer to GYN Musculoskeletal: Full ROM all peripheral extremities,5/5 strength, and normal gait. Rankle in brace.  No obvious deformity, laxity, effusion.  Skin: Warm, dry without rashes, lesions, ecchymosis. Neuro: Cranial nerves intact, reflexes equal bilaterally. Normal muscle tone, no cerebellar symptoms. Sensation intact.  Psych: Awake and oriented X 3, normal affect, Insight and Judgment appropriate.   PHQ-2: 0  EKG: T wave inversion lead 1, RSR', overall WNL ***  Carlyon Shadow Tayleigh Wetherell 9:50 AM [facility]

## 2019-02-24 ENCOUNTER — Encounter: Payer: Self-pay | Admitting: Adult Health

## 2019-03-17 ENCOUNTER — Encounter: Payer: Self-pay | Admitting: Adult Health

## 2019-04-21 IMAGING — DX DG ANKLE PORT 2V*R*
2 series · 2 of 2 positions shown · non-contrast
Comparison: None.

CLINICAL DATA: Status post right ankle surgery

EXAM:
PORTABLE RIGHT ANKLE - 2 VIEW

[ankle ap]
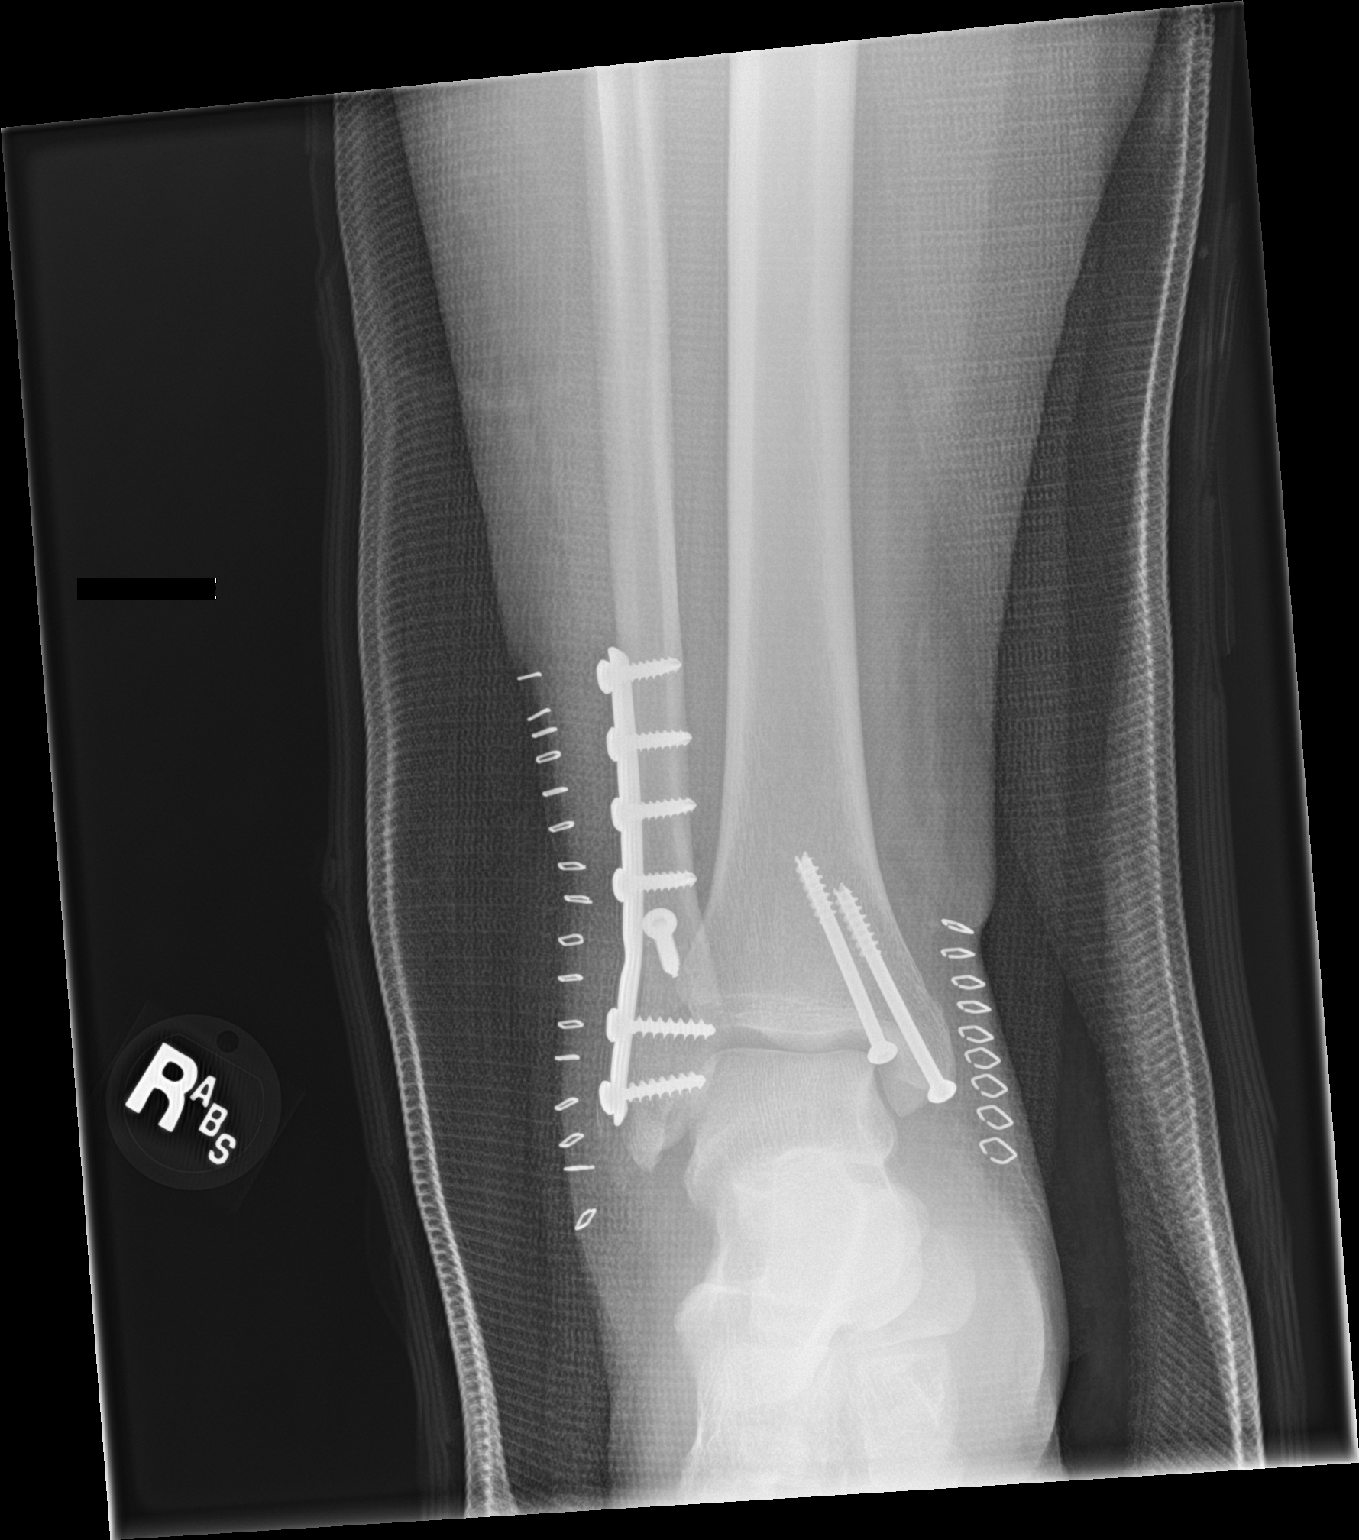

[ankle lat]
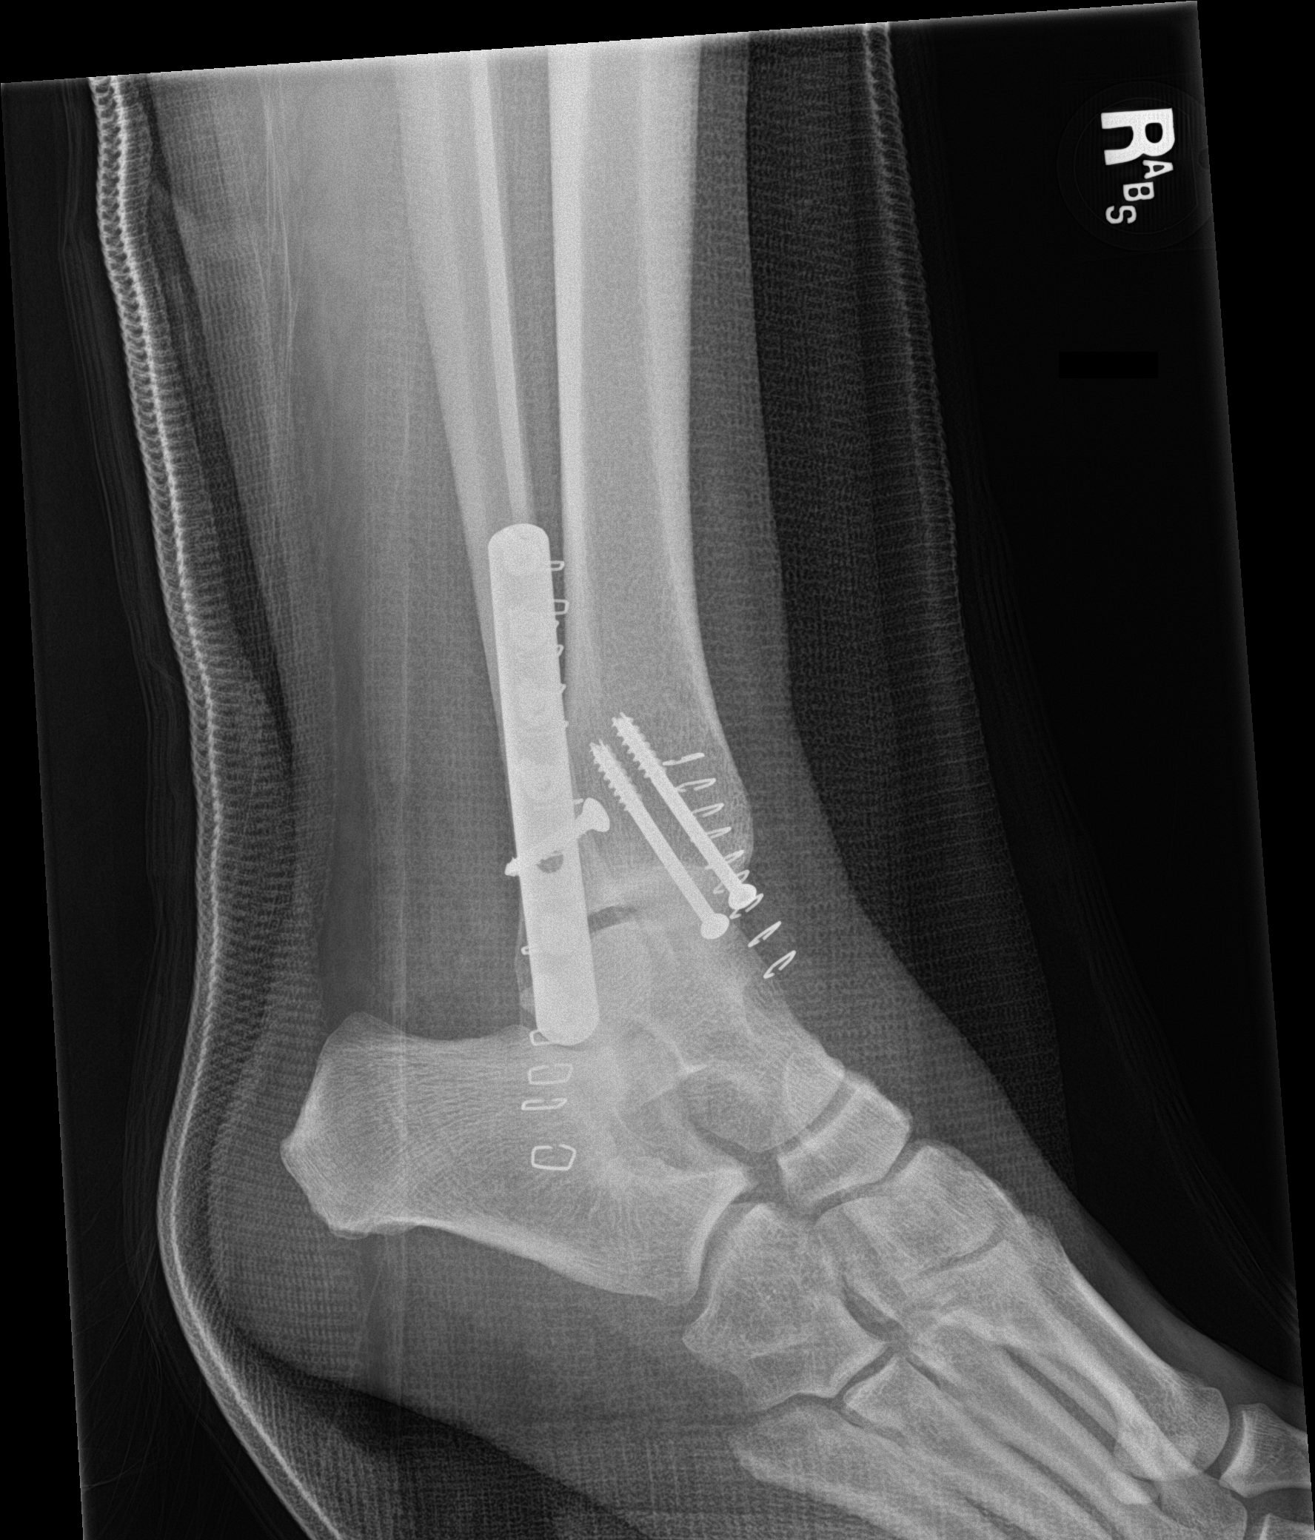

[2 of 2 positions shown; findings below may reference images not displayed]

FINDINGS: The right ankle is seen through cast. There are skin staples around
the ankle. There are 2 screws in the medial distal tibia without
malalignment. There is a orthopedic side plate fixated by horizontal
screws in the distal fibula without malalignment.
IMPRESSION: Postsurgical changes of the right ankle without malalignment.

## 2019-05-23 ENCOUNTER — Other Ambulatory Visit: Payer: Self-pay | Admitting: Adult Health

## 2019-05-23 DIAGNOSIS — I10 Essential (primary) hypertension: Secondary | ICD-10-CM

## 2019-07-03 ENCOUNTER — Ambulatory Visit: Payer: BC Managed Care – PPO | Attending: Internal Medicine

## 2019-07-03 DIAGNOSIS — Z23 Encounter for immunization: Secondary | ICD-10-CM

## 2019-07-03 NOTE — Progress Notes (Signed)
   Covid-19 Vaccination Clinic  Name:  CAROLIE MCILRATH    MRN: [id number] DOB: May 04, 1985  07/03/2019  Ms. Bousquet was observed post Covid-19 immunization for 15 minutes without incident. She was provided with Vaccine Information Sheet and instruction to access the V-Safe system.   Ms. Christoph was instructed to call 911 with any severe reactions post vaccine: Marland Kitchen Difficulty breathing  . Swelling of face and throat  . A fast heartbeat  . A bad rash all over body  . Dizziness and weakness   Immunizations Administered    Name Date Dose VIS Date Route   Pfizer COVID-19 Vaccine 07/03/2019 11:43 AM 0.3 mL 03/31/2018 Intramuscular   Manufacturer: [employer], [employer]   Lot: [id number]   NDC: [address]-0

## 2019-07-26 ENCOUNTER — Ambulatory Visit: Payer: BC Managed Care – PPO | Attending: Internal Medicine

## 2019-07-26 DIAGNOSIS — Z23 Encounter for immunization: Secondary | ICD-10-CM

## 2019-07-26 NOTE — Progress Notes (Signed)
   Covid-19 Vaccination Clinic  Name:  ACHOL AZPEITIA    MRN: [id number] DOB: October 28, 1985  07/26/2019  Ms. Haddaway was observed post Covid-19 immunization for 15 minutes without incident. She was provided with Vaccine Information Sheet and instruction to access the V-Safe system.   Ms. Buttacavoli was instructed to call 911 with any severe reactions post vaccine: Marland Kitchen Difficulty breathing  . Swelling of face and throat  . A fast heartbeat  . A bad rash all over body  . Dizziness and weakness   Immunizations Administered    Name Date Dose VIS Date Route   Pfizer COVID-19 Vaccine 07/26/2019  9:26 AM 0.3 mL 03/31/2018 Intramuscular   Manufacturer: [employer], [employer]   Lot: [id number]   NDC: [address]-6

## 2019-08-28 ENCOUNTER — Other Ambulatory Visit: Payer: Self-pay | Admitting: Internal Medicine

## 2019-08-28 DIAGNOSIS — I10 Essential (primary) hypertension: Secondary | ICD-10-CM

## 2019-11-22 NOTE — Progress Notes (Deleted)
COMPLETE PHYSICAL  Assessment and Plan: Health Maintenance- Discussed STD testing, safe sex, alcohol and drug awareness, drinking and driving dangers, wearing a seat belt and general safety measures for young adult.  Diagnoses and all orders for this visit:  Encounter for routine adult health examination without abnormal findings - referred back to GYN per patient request  Essential hypertension Monitor blood pressure at home; call if consistently over 130/80 Continue DASH diet.   Reminder to go to the ER if any CP, SOB, nausea, dizziness, severe HA, changes vision/speech, left arm numbness and tingling and jaw pain. -     CBC with Differential/Platelet -     COMPLETE METABOLIC PANEL WITH GFR -     Magnesium -     Urinalysis, Routine w reflex microscopic -     EKG 12-Lead  Eczema, unspecified type      -      Minor, currently managed without prescriptions  Low vitamin D level -     VITAMIN D 25 Hydroxy (Vit-D Deficiency, Fractures)  Body mass index (BMI) of 40.0 to 44.9 in adult [facility]) Long discussion about weight loss, diet, and exercise Discussed final goal weight  Patient on phentermine with benefit and no SE, will restart, taking drug breaks; continue close follow up. Return in 3 months   Screening cholesterol level -     Lipid panel  Screening for thyroid disorder -     TSH  Screening for diabetes mellitus -     Hemoglobin A1c  Anemia, unspecified type -     CBC with Differential/Platelet -     Vitamin B12  Discussed med's effects and SE's. Screening labs and tests as requested with regular follow-up as recommended. Over 40 minutes of exam, counseling, chart review and critical decision making was performed  Future Appointments  Date Time Provider Department Center  11/23/2019  3:00 PM Elder Negus, NP [facility] None  11/22/2020  3:00 PM Tisa Weisel, Bella Kennedy, NP [facility] None     HPI  This very nice single AA 34 y.o.female presents for a complete  physical. 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 She has Eczema; Body mass index (BMI) of 40.0 to 44.9 in adult [facility]); Vitamin D deficiency; Essential hypertension; B12 deficiency; and Fatigue on their problem list. 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   He endorses ongoing fatigue, was previously on B12 shots and reports this helped. She denies snoring. Reports she does awaken feeling rested. Denies AM headaches.   M ild eczema ongoing from childhood that she treats with OTC agents, and a lump to her left breast noted 8 years ago which was screened via [place] and MMG then biopsied; she reports this was "keloidal" and was not recommended any follow up.   She was referred to GYN and follows with Dr Gunnar Bulla.  This year she had ORIF of R ankle on 11/14/2017 by Dr. Martha Clan for displaced bimalleollar right ankle fracture after slipping at cheerleading practice. She reports she will also be having R knee scope in the next few weeks pending approval by insurance due to ligament sprain and lose cartilage fragment causing pain. She continues with PT twice weekly in the interim.  BMI is There is no height or weight on file to calculate BMI., she is working on diet and exercise. She has previously reported a 22 lb weight loss with phentermine but is currently off of this medication.   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 Denies CP, SOB.   Her blood pressure has been controlled at home, today their BP is    She does workout. She denies chest pain, shortness of  breath, dizziness.   She is not on cholesterol medication and denies myalgias. Her cholesterol is at goal. The cholesterol last visit was:   Lab Results  Component Value Date   CHOL 159 02/23/2018   HDL 48 (L) 02/23/2018   LDLCALC 94 02/23/2018   TRIG 83 02/23/2018   CHOLHDL 3.3 02/23/2018    She has been working on diet and exercise for glucose management, and denies increased appetite, nausea, paresthesia of the feet, polydipsia, polyuria, visual disturbances and vomiting. Last A1C in the office was:  Lab Results  Component Value Date   HGBA1C 5.5 02/23/2018   Patient is on Vitamin D supplement, taking 01093 IU daily but admits to taking irregularly  Lab Results  Component Value Date   VD25OH 26 (L) 09/03/2018     Lab Results  Component Value Date   GFRAA 106 09/03/2018     Current Medications:  Current Outpatient Medications on File Prior to Visit  Medication Sig Dispense Refill  . Cholecalciferol (VITAMIN D3) 10000 units TABS Take 1 tablet by mouth daily. WHEN SHE REMEMBERS    . cyanocobalamin (,VITAMIN B-12,) 1000 MCG/ML injection Inject 1 mL (1,000 mcg total) into the skin every 30 (thirty) days. Bring with you to each visit to have injected by the nurse. 30 mL 2  . meloxicam (MOBIC) 15 MG tablet Take 15 mg by mouth daily as needed for pain.    . Multiple Vitamin (MULTIVITAMIN WITH MINERALS) TABS tablet Take 1 tablet by mouth daily.    . phentermine (ADIPEX-P) 37.5 MG tablet Take 1/2 to 1 tablet every morning for dieting & weightloss 30 tablet 2  . triamterene-hydrochlorothiazide (MAXZIDE) 75-50 MG tablet Take 1 tablet every Morning for BP & Fluid Retention / Ankle Swelling 90 tablet 0   No current facility-administered medications on file prior to visit.   Health Maintenance:   Immunization History  Administered Date(s) Administered  . PFIZER SARS-COV-2 Vaccination 07/03/2019, 07/26/2019  . PPD Test 11/21/2016  . Td 02/05/2012    TD/TDAP: 2015 Influenza:  Declines Pneumovax: n/a Prevnar 13: n/a HPV vaccines: n/a  LMP: No LMP recorded.  Sexually Active: yes, STD screening declined today will get at GYN Pap: 2017 in [place] - never abnormal  MGM: Bilateral diagnostic 2013 related to L breast lump  Allergies: No Known Allergies Medical History:  has Eczema; Body mass index (BMI) of 40.0 to 44.9 in adult [facility]); Vitamin D deficiency; Essential hypertension; B12 deficiency; and Fatigue on their problem list. Surgical History:  She  has a past surgical history that includes Cyst removal trunk (lower back); Pilonidal cyst excision (2003); ORIF ankle fracture (Right, 11/14/2017); Breast biopsy (Left, 2010); and Knee arthroscopy (Right, 03/12/2018). Family History:  Her She was adopted. Family history is unknown by patient. Social History:   reports that she quit smoking about 16 years ago.  Her smoking use included cigarettes. She smoked 0.50 packs per day. She has never used smokeless tobacco. She reports current alcohol use. She reports that she does not use drugs.  Review of Systems: Review of Systems  Constitutional: Negative for chills, diaphoresis, fever, malaise/fatigue and weight loss.  HENT: Negative for congestion, ear discharge, ear pain, hearing loss, nosebleeds, sinus pain, sore throat and tinnitus.   Eyes: Negative for blurred vision, double vision, photophobia, pain, discharge and redness.  Respiratory: Negative for cough, hemoptysis, sputum production, shortness of breath, wheezing and stridor.   Cardiovascular: Negative for chest pain, palpitations, orthopnea, claudication, leg swelling and PND.  Gastrointestinal: Negative for abdominal pain, blood in stool, constipation, diarrhea, heartburn, melena, nausea and vomiting.  Genitourinary: Negative for dysuria, flank pain, frequency, hematuria and urgency.  Musculoskeletal: Negative for back pain, falls, joint pain, myalgias and neck pain.  Skin: Negative for itching and rash.   Neurological: Negative for dizziness, tingling, tremors, sensory change, speech change, focal weakness, seizures, loss of consciousness, weakness and headaches.  Endo/Heme/Allergies: Negative for environmental allergies and polydipsia. Does not bruise/bleed easily.  Psychiatric/Behavioral: Negative for depression, hallucinations, memory loss, substance abuse and suicidal ideas. The patient is not nervous/anxious and does not have insomnia.     Physical Exam: Estimated body mass index is 41.43 kg/m as calculated from the following:   Height as of 06/04/18: 5' 2.75" (1.594 m).   Weight as of 09/03/18: 232 lb (105.2 kg). There were no vitals taken for this visit. General Appearance: Well nourished, overweight female in no apparent distress.  Eyes: PERRLA, EOMs, conjunctiva no swelling or erythema, normal fundi and vessels.  Sinuses: No Frontal/maxillary tenderness  ENT/Mouth: Ext aud canals clear, normal light reflex with TMs without erythema, bulging. Good dentition. No erythema, swelling, or exudate on post pharynx. Tonsils not swollen or erythematous. Hearing normal.  Neck: Supple, thyroid normal. No bruits  Respiratory: Respiratory effort normal, BS equal bilaterally without rales, rhonchi, wheezing or stridor.  Cardio: RRR without murmurs, rubs or gallops. Brisk peripheral pulses without edema.  Chest: symmetric, with normal excursions and percussion.  Breasts: Defer to GYN Abdomen: Soft, nontender, no guarding, rebound, hernias, masses, or organomegaly.  Lymphatics: Non tender without lymphadenopathy.  Genitourinary: Defer to GYN Musculoskeletal: Full ROM all peripheral extremities,5/5 strength, and normal gait. Rankle in brace.  No obvious deformity, laxity, effusion.  Skin: Warm, dry without rashes, lesions, ecchymosis. Neuro: Cranial nerves intact, reflexes equal bilaterally. Normal muscle tone, no cerebellar symptoms. Sensation intact.  Psych: Awake and oriented X 3, normal affect,  Insight and Judgment appropriate.   PHQ-2: 0  EKG: T wave inversion lead 1, RSR', overall WNL     Loni Dolly, DNP [facility] 11/22/2019  11:43 PM

## 2019-11-23 ENCOUNTER — Encounter: Payer: BC Managed Care – PPO | Admitting: Adult Health Nurse Practitioner

## 2019-11-23 DIAGNOSIS — I10 Essential (primary) hypertension: Secondary | ICD-10-CM

## 2019-11-23 DIAGNOSIS — E538 Deficiency of other specified B group vitamins: Secondary | ICD-10-CM

## 2019-11-23 DIAGNOSIS — Z1329 Encounter for screening for other suspected endocrine disorder: Secondary | ICD-10-CM

## 2019-11-23 DIAGNOSIS — Z13220 Encounter for screening for lipoid disorders: Secondary | ICD-10-CM

## 2019-11-23 DIAGNOSIS — R7989 Other specified abnormal findings of blood chemistry: Secondary | ICD-10-CM

## 2019-11-23 DIAGNOSIS — Z131 Encounter for screening for diabetes mellitus: Secondary | ICD-10-CM

## 2019-11-23 DIAGNOSIS — Z0000 Encounter for general adult medical examination without abnormal findings: Secondary | ICD-10-CM

## 2019-11-23 DIAGNOSIS — L309 Dermatitis, unspecified: Secondary | ICD-10-CM

## 2019-11-23 DIAGNOSIS — Z6841 Body Mass Index (BMI) 40.0 and over, adult: Secondary | ICD-10-CM

## 2019-11-23 DIAGNOSIS — Z1389 Encounter for screening for other disorder: Secondary | ICD-10-CM

## 2019-12-02 ENCOUNTER — Encounter: Payer: Self-pay | Admitting: Adult Health Nurse Practitioner

## 2019-12-02 ENCOUNTER — Ambulatory Visit: Payer: BC Managed Care – PPO | Admitting: Adult Health Nurse Practitioner

## 2019-12-02 ENCOUNTER — Other Ambulatory Visit: Payer: Self-pay

## 2019-12-02 VITALS — BP 126/74 | HR 67 | Temp 97.3°F | Ht 63.0 in | Wt 243.0 lb

## 2019-12-02 DIAGNOSIS — I10 Essential (primary) hypertension: Secondary | ICD-10-CM | POA: Diagnosis not present

## 2019-12-02 DIAGNOSIS — Z1329 Encounter for screening for other suspected endocrine disorder: Secondary | ICD-10-CM

## 2019-12-02 DIAGNOSIS — Z79899 Other long term (current) drug therapy: Secondary | ICD-10-CM

## 2019-12-02 DIAGNOSIS — Z1389 Encounter for screening for other disorder: Secondary | ICD-10-CM | POA: Diagnosis not present

## 2019-12-02 DIAGNOSIS — R7989 Other specified abnormal findings of blood chemistry: Secondary | ICD-10-CM

## 2019-12-02 DIAGNOSIS — D649 Anemia, unspecified: Secondary | ICD-10-CM

## 2019-12-02 DIAGNOSIS — Z130 Encounter for screening for diseases of the blood and blood-forming organs and certain disorders involving the immune mechanism: Secondary | ICD-10-CM

## 2019-12-02 DIAGNOSIS — E559 Vitamin D deficiency, unspecified: Secondary | ICD-10-CM | POA: Diagnosis not present

## 2019-12-02 DIAGNOSIS — Z131 Encounter for screening for diabetes mellitus: Secondary | ICD-10-CM

## 2019-12-02 DIAGNOSIS — Z0000 Encounter for general adult medical examination without abnormal findings: Secondary | ICD-10-CM | POA: Diagnosis not present

## 2019-12-02 DIAGNOSIS — Z13220 Encounter for screening for lipoid disorders: Secondary | ICD-10-CM | POA: Diagnosis not present

## 2019-12-02 DIAGNOSIS — E538 Deficiency of other specified B group vitamins: Secondary | ICD-10-CM

## 2019-12-02 DIAGNOSIS — Z6841 Body Mass Index (BMI) 40.0 and over, adult: Secondary | ICD-10-CM

## 2019-12-02 DIAGNOSIS — L309 Dermatitis, unspecified: Secondary | ICD-10-CM

## 2019-12-02 MED ORDER — SAXENDA 18 MG/3ML ~~LOC~~ SOPN: 3.0000 mg | PEN_INJECTOR | Freq: Every day | SUBCUTANEOUS | 3 refills | Status: DC

## 2019-12-02 NOTE — Patient Instructions (Addendum)
We will respond with your lab results via MyChart.   Saxenda  0.6mg  injection x 1 week.  Increase dose by 0.6mg  each week to reach 3mg  daily.  If you have side effects, typically nausea.  Continue the same dose for that week.  ALSO take a close look at the foods you are eating.  Foods with a high fat content or fried foods will make you feel bad.  We will send in a prescription for the Saxenda.  Check with your pharmacy to see how much this will cost.     This first month we will work on cutting all sugary drinks.  Stop fruit juices, sweet tea, sodas and look at sugar content of any beverage you consume.  For example if your drink 3 a week, you can one, keep it on the same day.  Every month another small step will be added to help support the medication prescribed.  The medicine alone will not be effective.  It takes lifestyle and behavior changes to be successful with your goals and to maintain them.  Small changes make a large impact!  We will follow up in one month to check on your progress and a weight check.   GENERAL HEALTH GOALS  Know what a healthy weight is for you (roughly BMI <25) and aim to maintain this  Aim for 7+ servings of fruits and vegetables daily  70-80+ fluid ounces of water or unsweet tea for healthy kidneys  Limit to max 1 drink of alcohol per day; avoid smoking/tobacco  Limit animal fats in diet for cholesterol and heart health - choose grass fed whenever available  Avoid highly processed foods, and foods high in saturated/trans fats  Aim for low stress - take time to unwind and care for your mental health  Aim for 150 min of moderate intensity exercise weekly for heart health, and weights twice weekly for bone health  Aim for 7-9 hours of sleep daily   8 Critical Weight-Loss Tips That Aren't Diet and Exercise  1. STARVE THE DISTRACTIONS  All too often when we eat, we're also multitasking: watching TV, answering emails, scrolling through social media.  These habits are detrimental to having a strong, clear, healthy relationship with food, and they can hinder our ability to make dietary changes.  In order to truly focus on what you're eating, how much you're eating, why you're eating those specific foods and, most importantly, how those foods make you feel, you need to starve the distractions. That means when you eat, just eat. Focus on your food, the process it went through to end up on your plate, where it came from and how it nourishes you. With this technique, you're more likely to finish a meal feeling satiated.  2.  CONSIDER WHAT YOU'RE NOT WILLING TO DO  This might sound counterintuitive, but it can help provide a "why" when motivation is waning. Declare, in writing, what you are unwilling to do, for example "I am unwilling to be the old dad who cannot play sports with my children".  So consider what you're not willing to accept, write it down, and keep it at the ready.  3.  STOP LABELING FOOD "GOOD" AND "BAD"  You've probably heard someone say they ate something "bad." Maybe you've even said it yourself.  The trouble with 'bad' foods isn't that they'll send you to the grave after a bite or two. The trouble comes when we eat excessive portions of really calorie-dense foods meal after  meal, day after day.  Instead of labeling foods as good or bad, think about which foods you can eat a lot of, and which ones you should just eat a little of. Then, plan ways to eat the foods you really like in portions that fit with your overall goals. A good example of this would be having a slice of pizza alongside a club salad with chicken breast, avocado and a bit of dressing. This is vastly different than 3 slices of pizza, 4 breadsticks with cheese sauce and half of a liter of regular soda.  4.  BRUSH YOUR TEETH AFTER YOU EAT  Getting your mindset in order is important, but sometimes small habits can make a big difference. After eating, you still have  the taste of food in their mouth, which often causes people to eat more even if they are full or engage in a nibble or two of dessert.  Brushing your teeth will remove the taste of food from your mouth, and the clean, minty freshness will serve as a cue that mealtime is over.  5.  FOCUS ON CROWDING NOT CUTTING  The most common first step during 'dieting' is to cut. We cut our portion sizes down, we cut out 'bad' foods, we cut out entire food groups. This act of cutting puts [place] and our minds into scarcity mode.  When something is off-limits, even if you're able to avoid it for a while, you could end up bingeing on it later because you've gone so long without it. So, instead of cutting, focus on crowding. If you crowd your plate and fill it up with more foods like veggies and protein, it simply allows less room for the other stuff. In other words, shift your focus away from what you can't eat, and celebrate the foods that will help you reach your goals.  6.  TAKE TRACKING A STEP FURTHER  Track what you eat, when you ate it, how much you ate and how that food made you feel. Being completely honest with yourself and writing down every single thing that passes through your lips will help you start to notice that maybe you actually do snack, possibly take in more sugar than you thought, eat when you're bored rather than just hungry or maybe that you have a habit of snacking before bed while watching TV.  The difference from simply tracking your food intake is you're taking into account how food makes you feel, as well as what you're doing while you're eating. This is about becoming more mindful of what, when and why you eat.  7.  PRIORITIZE GOOD SLEEP  One of the strongest risk factors for being overweight is poor sleep. When you're feeling tired, you're more likely to choose unhealthy comfort foods and to skip your workout. Additionally, sleep deprivation may slow down your metabolism. Burnett Kanaris! Therefore,  sleeping 7-8 hours per night can help with weight loss without having to change your diet or increase your physical activity. And if you feel you snore and still wake up tired, talk with me about sleep apnea.  8.  SET ASIDE TIME TO DISCONNECT  Just get out there. Disconnect from the electronics and connect to the elements. Not only will this help reduce stress (a major factor in weight gain) by giving your mind a break from the constant stimulation we've all become so accustomed to, but it may also reprogram your brain to connect with yourself and what you're feeling.

## 2019-12-02 NOTE — Progress Notes (Signed)
COMPLETE PHYSICAL   Assessment and Plan: Health Maintenance- Discussed STD testing, safe sex, alcohol and drug awareness, drinking and driving dangers, wearing a seat belt and general safety measures for young adult.  Diagnoses and all orders for this visit:  Encounter for routine adult health examination without abnormal findings - referred back to GYN per patient request  Essential hypertension Monitor blood pressure at home; call if consistently over 130/80 Continue DASH diet.   Reminder to go to the ER if any CP, SOB, nausea, dizziness, severe HA, changes vision/speech, left arm numbness and tingling and jaw pain. -     CBC with Differential/Platelet -     COMPLETE METABOLIC PANEL WITH GFR -     Magnesium -     Urinalysis, Routine w reflex microscopic -     EKG 12-Lead  Eczema, unspecified type      -      Minor, currently managed without prescriptions  Low vitamin D level -     VITAMIN D 25 Hydroxy (Vit-D Deficiency, Fractures)  Morbid obesity, Body mass index (BMI) of 40.0 to 44.9 in adult [facility]) Long discussion about weight loss, diet, and exercise Discussed final goal weight  Will start saxenda in office today Teach back Discussed SE,  continue close follow up  Return in 1 months   Screening cholesterol level -     Lipid panel  Screening for thyroid disorder -     TSH  Screening for diabetes mellitus -     Hemoglobin A1c  Anemia, unspecified type -     CBC with Differential/Platelet -     Vitamin B12  Medication management Contiued   Discussed med's effects and SE's. Screening labs and tests as requested with regular follow-up as recommended. Over 40 minutes of face to face exam, counseling, chart review and critical decision making was performed  Future Appointments  Date Time Provider Department Center  11/23/2019  3:00 PM Elder Negus, NP [facility] None  11/22/2020  3:00 PM Timarion Agcaoili, NP [facility] None     HPI  This single AA 34  y.o.female presents for a complete physical. 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 She has Eczema; Body mass index (BMI) of 40.0 to 44.9 in adult [facility]); Vitamin D deficiency; Essential hypertension; B12 deficiency; and Fatigue on their problem list. She treats   She is prescribed oral birthcontrolbut she reports she is not consistent, holding up a full package from her purse.  Dr Cherly Hensen follows for womens health.  Discussed birth control options with the patient, IUD, OBC pills.Marland KitchenMarland KitchenShe is considering options.  Current form of birth control is prayer. 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   This year she had ORIF of R ankle on 11/14/2017 by Dr. Martha Clan for displaced bimalleollar right ankle fracture after slipping at cheerleading practice. She reports she will also be having R knee scope in the next few weeks pending approval by insurance due to ligament sprain and lose cartilage fragment causing pain. She continues with PT twice weekly in the interim.  BMI is There is no height or weight on file to calculate BMI., she is working on diet and exercise. She has previously reported a 22  lb weight loss with phentermine but is currently off of this medication.   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 Denies CP, SOB.   Her blood pressure has been controlled at home, today their BP is    She does workout. She denies chest pain, shortness of breath, dizziness.   She is not on cholesterol medication and denies myalgias. Her cholesterol is at goal. The cholesterol last visit  was:   Lab Results  Component Value Date   CHOL 159 02/23/2018   HDL 48 (L) 02/23/2018   LDLCALC 94 02/23/2018   TRIG 83 02/23/2018   CHOLHDL 3.3 02/23/2018    She has been working on diet and exercise for glucose management, and denies increased appetite, nausea, paresthesia of the feet, polydipsia, polyuria, visual disturbances and vomiting. Last A1C in the office was:  Lab Results  Component Value Date   HGBA1C 5.5 02/23/2018   Patient is on Vitamin D supplement, taking 65035 IU daily but admits to taking irregularly  Lab Results  Component Value Date   VD25OH 26 (L) 09/03/2018     Lab Results  Component Value Date   GFRAA 106 09/03/2018     Current Medications:  Current Outpatient Medications on File Prior to Visit  Medication Sig Dispense Refill  . Cholecalciferol (VITAMIN D3) 10000 units TABS Take 1 tablet by mouth daily. WHEN SHE REMEMBERS    . cyanocobalamin (,VITAMIN B-12,) 1000 MCG/ML injection Inject 1 mL (1,000 mcg total) into the skin every 30 (thirty) days. Bring with you to each visit to have injected by the nurse. 30 mL 2  . meloxicam (MOBIC) 15 MG tablet Take 15 mg by mouth daily as needed for pain.    . Multiple Vitamin (MULTIVITAMIN WITH MINERALS) TABS tablet Take 1 tablet by mouth daily.    . phentermine (ADIPEX-P) 37.5 MG tablet Take 1/2 to 1 tablet every morning for dieting & weightloss 30 tablet 2  . triamterene-hydrochlorothiazide (MAXZIDE) 75-50 MG tablet Take 1 tablet every Morning for BP & Fluid Retention / Ankle Swelling 90 tablet 0   No current facility-administered medications on file prior to visit.   Health Maintenance:   Immunization History  Administered Date(s) Administered  . PFIZER SARS-COV-2 Vaccination 07/03/2019, 07/26/2019  . PPD Test 11/21/2016  . Td 02/05/2012    TD/TDAP: 2015 Influenza: Declines Pneumovax: n/a Prevnar 13: n/a HPV vaccines: n/a  LMP: No LMP recorded.  Sexually Active: yes, STD screening declined today will  get at GYN Pap: DUE, Dr Cherly H[facility]: Bilateral diagnostic 2013 related to L breast lump  Allergies: No Known Allergies Medical History:  has Eczema; Body mass index (BMI) of 40.0 to 44.9 in adult [facility]); Vitamin D deficiency; Essential hypertension; B12 deficiency; and Fatigue on their problem list. Surgical History:  She  has a past surgical history that includes Cyst removal trunk (lower back); Pilonidal cyst excision (2003); ORIF ankle fracture (Right, 11/14/2017); Breast biopsy (Left, 2010); and Knee arthroscopy (Right, 03/12/2018). Family History:  Her She was adopted. Family history is unknown by patient. Social History:   reports that she quit smoking about 16 years ago. Her smoking use included cigarettes. She smoked 0.50 packs per day. She has never used smokeless tobacco. She reports current alcohol use. She reports that she does not use drugs.  Review of Systems: Review of Systems  Constitutional: Negative for chills, diaphoresis, fever, malaise/fatigue and weight loss.  HENT: Negative for congestion, ear discharge, ear pain, hearing loss, nosebleeds, sinus pain, sore throat and tinnitus.   Eyes: Negative for blurred vision, double vision, photophobia, pain, discharge and redness.  Respiratory: Negative for cough, hemoptysis, sputum production, shortness of breath, wheezing and stridor.   Cardiovascular: Negative for chest pain, palpitations, orthopnea, claudication, leg swelling and PND.  Gastrointestinal: Negative for abdominal pain, blood in stool, constipation, diarrhea, heartburn, melena, nausea and vomiting.  Genitourinary: Negative for dysuria, flank pain, frequency, hematuria and urgency.  Musculoskeletal: Negative for back pain, falls, joint pain, myalgias and neck pain.  Skin: Negative for itching and rash.  Neurological: Negative for dizziness, tingling, tremors, sensory change, speech change, focal weakness, seizures, loss of consciousness, weakness and headaches.   Endo/Heme/Allergies: Negative for environmental allergies and polydipsia. Does not bruise/bleed easily.  Psychiatric/Behavioral: Negative for depression, hallucinations, memory loss, substance abuse and suicidal ideas. The patient is not nervous/anxious and does not have insomnia.     Physical Exam: Estimated body mass index is 41.43 kg/m as calculated from the following:   Height as of 06/04/18: 5' 2.75" (1.594 m).   Weight as of 09/03/18: 232 lb (105.2 kg). There were no vitals taken for this visit. General Appearance: Well nourished, overweight female in no apparent distress.  Eyes: PERRLA, EOMs, conjunctiva no swelling or erythema, normal fundi and vessels.  Sinuses: No Frontal/maxillary tenderness  ENT/Mouth: Ext aud canals clear, normal light reflex with TMs without erythema, bulging. Good dentition. No erythema, swelling, or exudate on post pharynx. Tonsils not swollen or erythematous. Hearing normal.  Neck: Supple, thyroid normal. No bruits  Respiratory: Respiratory effort normal, BS equal bilaterally without rales, rhonchi, wheezing or stridor.  Cardio: RRR without murmurs, rubs or gallops. Brisk peripheral pulses without edema.  Chest: symmetric, with normal excursions and percussion.  Breasts: Defer to GYN Abdomen: Soft, nontender, no guarding, rebound, hernias, masses, or organomegaly.  Lymphatics: Non tender without lymphadenopathy.  Genitourinary: Defer to GYN Musculoskeletal: Full ROM all peripheral extremities,5/5 strength, and normal gait. Rankle in brace.  No obvious deformity, laxity, effusion.  Skin: Warm, dry without rashes, lesions, ecchymosis. Neuro: Cranial nerves intact, reflexes equal bilaterally. Normal muscle tone, no cerebellar symptoms. Sensation intact.  Psych: Awake and oriented X 3, normal affect, Insight and Judgment appropriate.   PHQ-2: 0  EKG: T wave inversion lead 1, RSR, overall WNL     Loni Dolly, DNP [facility] 11/22/2019  11:43 PM

## 2019-12-03 LAB — CBC WITH DIFFERENTIAL/PLATELET
Absolute Monocytes: 486 cells/uL (ref 200–950)
Basophils Absolute: 32 cells/uL (ref 0–200)
Basophils Relative: 0.4 %
Eosinophils Absolute: 89 cells/uL (ref 15–500)
Eosinophils Relative: 1.1 %
HCT: 36.3 % (ref 35.0–45.0)
Hemoglobin: 12.5 g/dL (ref 11.7–15.5)
Lymphs Abs: 3515 cells/uL (ref 850–3900)
MCH: 28.6 pg (ref 27.0–33.0)
MCHC: 34.4 g/dL (ref 32.0–36.0)
MCV: 83.1 fL (ref 80.0–100.0)
MPV: 10.5 fL (ref 7.5–12.5)
Monocytes Relative: 6 %
Neutro Abs: 3977 cells/uL (ref 1500–7800)
Neutrophils Relative %: 49.1 %
Platelets: 369 10*3/uL (ref 140–400)
RBC: 4.37 10*6/uL (ref 3.80–5.10)
RDW: 12.6 % (ref 11.0–15.0)
Total Lymphocyte: 43.4 %
WBC: 8.1 10*3/uL (ref 3.8–10.8)

## 2019-12-03 LAB — COMPLETE METABOLIC PANEL WITH GFR
AG Ratio: 1.5 (calc) (ref 1.0–2.5)
ALT: 24 U/L (ref 6–29)
AST: 13 U/L (ref 10–30)
Albumin: 4 g/dL (ref 3.6–5.1)
Alkaline phosphatase (APISO): 63 U/L (ref 31–125)
BUN/Creatinine Ratio: 7 (calc) (ref 6–22)
BUN: 5 mg/dL — ABNORMAL LOW (ref 7–25)
CO2: 26 mmol/L (ref 20–32)
Calcium: 9.4 mg/dL (ref 8.6–10.2)
Chloride: 108 mmol/L (ref 98–110)
Creat: 0.71 mg/dL (ref 0.50–1.10)
GFR, Est African American: 127 mL/min/{1.73_m2} (ref >=60)
GFR, Est Non African American: 110 mL/min/{1.73_m2} (ref >=60)
Globulin: 2.6 g/dL (calc) (ref 1.9–3.7)
Glucose, Bld: 81 mg/dL (ref 65–99)
Potassium: 4.3 mmol/L (ref 3.5–5.3)
Sodium: 138 mmol/L (ref 135–146)
Total Bilirubin: 0.2 mg/dL (ref 0.2–1.2)
Total Protein: 6.6 g/dL (ref 6.1–8.1)

## 2019-12-03 LAB — VITAMIN D 25 HYDROXY (VIT D DEFICIENCY, FRACTURES): Vit D, 25-Hydroxy: 25 ng/mL — ABNORMAL LOW (ref 30–100)

## 2019-12-03 LAB — TSH: TSH: 1.74 mIU/L

## 2019-12-03 LAB — VITAMIN B12: Vitamin B-12: 388 pg/mL (ref 200–1100)

## 2019-12-03 LAB — HEMOGLOBIN A1C
Hgb A1c MFr Bld: 5.4 % of total Hgb (ref <5.7)
Mean Plasma Glucose: 108 (calc)
eAG (mmol/L): 6 (calc)

## 2019-12-03 LAB — URINALYSIS W MICROSCOPIC + REFLEX CULTURE
Bilirubin Urine: NEGATIVE
Glucose, UA: NEGATIVE
Hgb urine dipstick: NEGATIVE
Hyaline Cast: NONE SEEN /LPF
Ketones, ur: NEGATIVE
Leukocyte Esterase: NEGATIVE
Nitrites, Initial: NEGATIVE
Protein, ur: NEGATIVE
RBC / HPF: NONE SEEN /HPF (ref 0–2)
Specific Gravity, Urine: 1.016 (ref 1.001–1.03)
pH: 7.5 (ref 5.0–8.0)

## 2019-12-03 LAB — NO CULTURE INDICATED

## 2019-12-03 LAB — MAGNESIUM: Magnesium: 2.1 mg/dL (ref 1.5–2.5)

## 2020-01-02 NOTE — Progress Notes (Deleted)
FOLLOW UP 1 MONTH   Assessment and Plan: Health Maintenance- Discussed STD testing, safe sex, alcohol and drug awareness, drinking and driving dangers, wearing a seat belt and general safety measures for young adult.  Diagnoses and all orders for this visit:    Essential hypertension Monitor blood pressure at home; call if consistently over 130/80 Continue DASH diet.   Reminder to go to the ER if any CP, SOB, nausea, dizziness, severe HA, changes vision/speech, left arm numbness and tingling and jaw pain. -     CBC with Differential/Platelet -     COMPLETE METABOLIC PANEL WITH GFR -     Magnesium -     Urinalysis, Routine w reflex microscopic -     EKG 12-Lead  Eczema, unspecified type      -      Minor, currently managed without prescriptions  Low vitamin D level -     VITAMIN D 25 Hydroxy (Vit-D Deficiency, Fractures)  Morbid obesity, Body mass index (BMI) of 40.0 to 44.9 in adult [facility]) Long discussion about weight loss, diet, and exercise Discussed final goal weight  Will start saxenda in office today Teach back Discussed SE,  continue close follow up  Return in 1 months   Screening cholesterol level -     Lipid panel  Screening for thyroid disorder -     TSH  Screening for diabetes mellitus -     Hemoglobin A1c  Anemia, unspecified type -     CBC with Differential/Platelet -     Vitamin B12  Medication management Contiued   Discussed med's effects and SE's. Screening labs and tests as requested with regular follow-up as recommended. Over 40 minutes of face to face exam, counseling, chart review and critical decision making was performed  Future Appointments  Date Time Provider Department Center  11/23/2019  3:00 PM Elder Negus, NP [facility] None  11/22/2020  3:00 PM Johnny Latu, NP [facility] None     HPI  This single AA 34 y.o.female presents for a complete physical. 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 She has Eczema; Body mass index (BMI) of 40.0 to 44.9 in adult [facility]); Vitamin D deficiency; Essential hypertension; B12 deficiency; and Fatigue on their problem list. She treats   She is prescribed oral birthcontrolbut she reports she is not consistent, holding up a full package from her purse.  Dr Cherly Hensen follows for womens health.  Discussed birth control options with the patient, IUD, OBC pills.Marland KitchenMarland KitchenShe is considering options.  Current form of birth control is prayer. 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   This year she had ORIF of R ankle on 11/14/2017 by Dr. Martha Clan for displaced bimalleollar right ankle fracture after slipping at cheerleading practice. She reports she will also be having R knee scope in the next few weeks pending approval by insurance due to ligament sprain and lose cartilage fragment causing pain. She continues with PT twice weekly in the interim.  BMI is There is no height or weight on file to calculate BMI., she is working on diet and exercise. She has previously reported a 22 lb weight loss with phentermine but is currently off of this medication.  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 Denies CP, SOB.   Her blood pressure has been controlled at home, today their BP is    She does workout. She denies chest pain, shortness of breath, dizziness.   She is not on cholesterol medication and denies myalgias. Her cholesterol is at goal. The cholesterol last visit was:   Lab Results  Component Value Date   CHOL 159 02/23/2018   HDL 48 (L) 02/23/2018   LDLCALC 94 02/23/2018    TRIG 83 02/23/2018   CHOLHDL 3.3 02/23/2018    She has been working on diet and exercise for glucose management, and denies increased appetite, nausea, paresthesia of the feet, polydipsia, polyuria, visual disturbances and vomiting. Last A1C in the office was:  Lab Results  Component Value Date   HGBA1C 5.5 02/23/2018   Patient is on Vitamin D supplement, taking 46962 IU daily but admits to taking irregularly  Lab Results  Component Value Date   VD25OH 26 (L) 09/03/2018     Lab Results  Component Value Date   GFRAA 106 09/03/2018     Current Medications:  Current Outpatient Medications on File Prior to Visit  Medication Sig Dispense Refill  . Cholecalciferol (VITAMIN D3) 10000 units TABS Take 1 tablet by mouth daily. WHEN SHE REMEMBERS    . cyanocobalamin (,VITAMIN B-12,) 1000 MCG/ML injection Inject 1 mL (1,000 mcg total) into the skin every 30 (thirty) days. Bring with you to each visit to have injected by the nurse. 30 mL 2  . meloxicam (MOBIC) 15 MG tablet Take 15 mg by mouth daily as needed for pain.    . Multiple Vitamin (MULTIVITAMIN WITH MINERALS) TABS tablet Take 1 tablet by mouth daily.    . phentermine (ADIPEX-P) 37.5 MG tablet Take 1/2 to 1 tablet every morning for dieting & weightloss 30 tablet 2  . triamterene-hydrochlorothiazide (MAXZIDE) 75-50 MG tablet Take 1 tablet every Morning for BP & Fluid Retention / Ankle Swelling 90 tablet 0   No current facility-administered medications on file prior to visit.   Health Maintenance:   Immunization History  Administered Date(s) Administered  . PFIZER SARS-COV-2 Vaccination 07/03/2019, 07/26/2019  . PPD Test 11/21/2016  . Td 02/05/2012    TD/TDAP: 2015 Influenza: Declines Pneumovax: n/a Prevnar 13: n/a HPV vaccines: n/a  LMP: No LMP recorded.  Sexually Active: yes, STD screening declined today will get at GYN Pap: DUE, Dr Cherly Hensen [facility]: Bilateral diagnostic 2013 related to L breast lump  Allergies: No Known  Allergies Medical History:  has Eczema; Body mass index (BMI) of 40.0 to 44.9 in adult [facility]); Vitamin D deficiency; Essential hypertension; B12 deficiency; and Fatigue on their problem list. Surgical History:  She  has a past surgical history that includes Cyst removal trunk (lower back); Pilonidal cyst excision (2003); ORIF ankle fracture (Right, 11/14/2017); Breast biopsy (Left, 2010); and Knee arthroscopy (Right, 03/12/2018). Family History:  Her She was adopted. Family history is unknown by patient. Social History:   reports that she quit smoking about 16 years ago. Her smoking use included cigarettes. She smoked 0.50 packs per day. She has never used smokeless tobacco. She reports current alcohol use. She reports that she does not use drugs.  Review of Systems: Review of Systems  Constitutional: Negative for chills, diaphoresis, fever,  malaise/fatigue and weight loss.  HENT: Negative for congestion, ear discharge, ear pain, hearing loss, nosebleeds, sinus pain, sore throat and tinnitus.   Eyes: Negative for blurred vision, double vision, photophobia, pain, discharge and redness.  Respiratory: Negative for cough, hemoptysis, sputum production, shortness of breath, wheezing and stridor.   Cardiovascular: Negative for chest pain, palpitations, orthopnea, claudication, leg swelling and PND.  Gastrointestinal: Negative for abdominal pain, blood in stool, constipation, diarrhea, heartburn, melena, nausea and vomiting.  Genitourinary: Negative for dysuria, flank pain, frequency, hematuria and urgency.  Musculoskeletal: Negative for back pain, falls, joint pain, myalgias and neck pain.  Skin: Negative for itching and rash.  Neurological: Negative for dizziness, tingling, tremors, sensory change, speech change, focal weakness, seizures, loss of consciousness, weakness and headaches.  Endo/Heme/Allergies: Negative for environmental allergies and polydipsia. Does not bruise/bleed easily.   Psychiatric/Behavioral: Negative for depression, hallucinations, memory loss, substance abuse and suicidal ideas. The patient is not nervous/anxious and does not have insomnia.     Physical Exam: Estimated body mass index is 41.43 kg/m as calculated from the following:   Height as of 06/04/18: 5' 2.75" (1.594 m).   Weight as of 09/03/18: 232 lb (105.2 kg). There were no vitals taken for this visit. General Appearance: Well nourished, overweight female in no apparent distress.  Eyes: PERRLA, EOMs, conjunctiva no swelling or erythema, normal fundi and vessels.  Sinuses: No Frontal/maxillary tenderness  ENT/Mouth: Ext aud canals clear, normal light reflex with TMs without erythema, bulging. Good dentition. No erythema, swelling, or exudate on post pharynx. Tonsils not swollen or erythematous. Hearing normal.  Neck: Supple, thyroid normal. No bruits  Respiratory: Respiratory effort normal, BS equal bilaterally without rales, rhonchi, wheezing or stridor.  Cardio: RRR without murmurs, rubs or gallops. Brisk peripheral pulses without edema.  Chest: symmetric, with normal excursions and percussion.  Breasts: Defer to GYN Abdomen: Soft, nontender, no guarding, rebound, hernias, masses, or organomegaly.  Lymphatics: Non tender without lymphadenopathy.  Genitourinary: Defer to GYN Musculoskeletal: Full ROM all peripheral extremities,5/5 strength, and normal gait. Rankle in brace.  No obvious deformity, laxity, effusion.  Skin: Warm, dry without rashes, lesions, ecchymosis. Neuro: Cranial nerves intact, reflexes equal bilaterally. Normal muscle tone, no cerebellar symptoms. Sensation intact.  Psych: Awake and oriented X 3, normal affect, Insight and Judgment appropriate.   PHQ-2: 0  EKG: T wave inversion lead 1, RSR, overall WNL     Loni Dolly, DNP [facility] 11/22/2019  11:43 PM

## 2020-01-04 ENCOUNTER — Ambulatory Visit: Payer: BC Managed Care – PPO | Admitting: Adult Health Nurse Practitioner

## 2020-02-24 ENCOUNTER — Encounter: Payer: BC Managed Care – PPO | Admitting: Adult Health

## 2020-03-13 ENCOUNTER — Ambulatory Visit (INDEPENDENT_AMBULATORY_CARE_PROVIDER_SITE_OTHER): Payer: BC Managed Care – PPO

## 2020-03-13 ENCOUNTER — Other Ambulatory Visit: Payer: Self-pay

## 2020-03-13 VITALS — Temp 97.6°F | Ht 63.0 in | Wt 243.0 lb

## 2020-03-13 DIAGNOSIS — Z111 Encounter for screening for respiratory tuberculosis: Secondary | ICD-10-CM

## 2020-03-13 NOTE — Progress Notes (Signed)
Pt reports for TB screening.  Pt will report any issue & will report for PPD reading on WED.

## 2020-03-15 LAB — TB SKIN TEST
Induration: 0 mm
TB Skin Test: NEGATIVE

## 2020-03-16 ENCOUNTER — Encounter: Payer: BC Managed Care – PPO | Admitting: Adult Health

## 2020-04-05 ENCOUNTER — Encounter: Payer: Self-pay | Admitting: Adult Health Nurse Practitioner

## 2020-04-05 ENCOUNTER — Ambulatory Visit (INDEPENDENT_AMBULATORY_CARE_PROVIDER_SITE_OTHER): Payer: BC Managed Care – PPO | Admitting: Adult Health Nurse Practitioner

## 2020-04-05 ENCOUNTER — Other Ambulatory Visit: Payer: Self-pay

## 2020-04-05 VITALS — BP 134/74 | HR 63 | Temp 97.5°F | Wt 246.0 lb

## 2020-04-05 DIAGNOSIS — Z79899 Other long term (current) drug therapy: Secondary | ICD-10-CM

## 2020-04-05 DIAGNOSIS — Z0289 Encounter for other administrative examinations: Secondary | ICD-10-CM

## 2020-04-05 DIAGNOSIS — I10 Essential (primary) hypertension: Secondary | ICD-10-CM | POA: Diagnosis not present

## 2020-04-05 DIAGNOSIS — E538 Deficiency of other specified B group vitamins: Secondary | ICD-10-CM

## 2020-04-05 DIAGNOSIS — R7989 Other specified abnormal findings of blood chemistry: Secondary | ICD-10-CM

## 2020-04-05 DIAGNOSIS — E559 Vitamin D deficiency, unspecified: Secondary | ICD-10-CM

## 2020-04-05 LAB — COMPLETE METABOLIC PANEL WITH GFR
AG Ratio: 1.5 (calc) (ref 1.0–2.5)
ALT: 16 U/L (ref 6–29)
AST: 13 U/L (ref 10–30)
Albumin: 4.1 g/dL (ref 3.6–5.1)
Alkaline phosphatase (APISO): 72 U/L (ref 31–125)
BUN: 9 mg/dL (ref 7–25)
CO2: 27 mmol/L (ref 20–32)
Calcium: 9.4 mg/dL (ref 8.6–10.2)
Chloride: 102 mmol/L (ref 98–110)
Creat: 0.69 mg/dL (ref 0.50–1.10)
GFR, Est African American: 129 mL/min/{1.73_m2} (ref >=60)
GFR, Est Non African American: 111 mL/min/{1.73_m2} (ref >=60)
Globulin: 2.7 g/dL (calc) (ref 1.9–3.7)
Glucose, Bld: 97 mg/dL (ref 65–99)
Potassium: 4 mmol/L (ref 3.5–5.3)
Sodium: 136 mmol/L (ref 135–146)
Total Bilirubin: 0.2 mg/dL (ref 0.2–1.2)
Total Protein: 6.8 g/dL (ref 6.1–8.1)

## 2020-04-05 LAB — CBC WITH DIFFERENTIAL/PLATELET
Absolute Monocytes: 537 cells/uL (ref 200–950)
Basophils Absolute: 46 cells/uL (ref 0–200)
Basophils Relative: 0.5 %
Eosinophils Absolute: 200 cells/uL (ref 15–500)
Eosinophils Relative: 2.2 %
HCT: 37.7 % (ref 35.0–45.0)
Hemoglobin: 12.9 g/dL (ref 11.7–15.5)
Lymphs Abs: 3485 cells/uL (ref 850–3900)
MCH: 27.8 pg (ref 27.0–33.0)
MCHC: 34.2 g/dL (ref 32.0–36.0)
MCV: 81.3 fL (ref 80.0–100.0)
MPV: 10.5 fL (ref 7.5–12.5)
Monocytes Relative: 5.9 %
Neutro Abs: 4832 cells/uL (ref 1500–7800)
Neutrophils Relative %: 53.1 %
Platelets: 430 10*3/uL — ABNORMAL HIGH (ref 140–400)
RBC: 4.64 10*6/uL (ref 3.80–5.10)
RDW: 12.4 % (ref 11.0–15.0)
Total Lymphocyte: 38.3 %
WBC: 9.1 10*3/uL (ref 3.8–10.8)

## 2020-04-05 LAB — VITAMIN D 25 HYDROXY (VIT D DEFICIENCY, FRACTURES): Vit D, 25-Hydroxy: 30 ng/mL (ref 30–100)

## 2020-04-05 LAB — VITAMIN B12: Vitamin B-12: 466 pg/mL (ref 200–1100)

## 2020-04-05 NOTE — Progress Notes (Signed)
FOLLOW UP  Assessment and Plan: Health Maintenance- Discussed STD testing, safe sex, alcohol and drug awareness, drinking and driving dangers, wearing a seat belt and general safety measures for young adult.  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 -     Vitamin B12  Essential hypertension No medicaitosn, controlled today Monitor blood pressure at home; call if consistently over 130/80 Continue DASH diet.   Reminder to go to the ER if any CP, SOB, nausea, dizziness, severe HA, changes vision/speech, left arm numbness and tingling and jaw pain. -     CBC with Differential/Platelet -     COMPLETE METABOLIC PANEL WITH GFR  Medication management Continued  Discussed importance of follow up for continued uncontrolled deficiencies.  Discussed med's effects and SE's. Screening labs and tests as requested with regular follow-up as recommended. Over 30 minutes of face to face exam, counseling, chart review and critical decision making was performed  Future Appointments  Date Time Provider Department Center  11/23/2019  3:00 PM Elder Negus, NP [facility] None  11/22/2020  3:00 PM Flordia Kassem, Bella Kennedy, NP [facility] None     HPI   35 y.o.female presents for copletion of physical form for her school.   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 She has Eczema; Body mass index (BMI) of 40.0 to 44.9 in adult [facility]); Vitamin D deficiency; Essential hypertension; B12 deficiency; and Fatigue on their problem list.   She expresses displeasure with having to come to the office today for the form to be  filled out  Last OV was 12/02/35.  Discussed importance of follow up as she has had changes to her health since this time.  Assessment necessary to complete requests on form.  She reports she became pregnant shortly after last OV.  Reports she had a miscarriage, not forthcoming with time frame.  Reports this occurred at home.  Reports she did not seek any medical attention or evaluation for this.  Denies any complications.  Reports she is sexually active, one partner.  No birth control at this time. Dr Cherly Hensen follows for womens health.    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 Reports there have been lots of stressful occurences in her family.  She does not want to go into detail of this.  She has vitamin D deficiency, not taking supplementation at this time.  Discussed with patient.  She also has history of B12 deficiency and she reports the oral and sublingual does not work for her.  She has done injections in the past for this and would like to do this again.  She is not working on diet and exercise at this time.  Wt Readings from Last 3 Encounters:  09/03/18 232 lb (105.2 kg)  06/04/18 231 lb (104.8 kg)  03/12/18 231 lb 1 oz (104.8 kg)    She was working out  but not since  Dancing, zumba, cardio at gym pre-covid. Denies any chest pains or shortness of breath.  Her blood pressure has been controlled at home, today their BP is  134/74  She does workout. She denies chest pain, shortness of breath, dizziness.   She is not on cholesterol medication and denies myalgias. Her cholesterol is at goal. The cholesterol last visit was:   Lab Results  Component Value Date   CHOL 159 02/23/2018   HDL 48 (L) 02/23/2018   LDLCALC 94 02/23/2018   TRIG 83 02/23/2018    CHOLHDL 3.3 02/23/2018    She has not been working on diet and exercise for glucose management, and denies increased appetite, nausea, paresthesia of the feet, polydipsia, polyuria, visual disturbances and vomiting. Last A1C in the office was:  Lab Results  Component Value Date   HGBA1C 5.5 02/23/2018   Patient is not on Vitamin D supplement at this time. Lab Results  Component Value Date   VD25OH 26 (L) 09/03/2018     Lab Results  Component Value Date   GFRAA 106 09/03/2018     Current Medications:  Current Outpatient Medications on File Prior to Visit  Medication Sig Dispense Refill  . Cholecalciferol (VITAMIN D3) 10000 units TABS Take 1 tablet by mouth daily. WHEN SHE REMEMBERS    . cyanocobalamin (,VITAMIN B-12,) 1000 MCG/ML injection Inject 1 mL (1,000 mcg total) into the skin every 30 (thirty) days. Bring with you to each visit to have injected by the nurse. 30 mL 2  . meloxicam (MOBIC) 15 MG tablet Take 15 mg by mouth daily as needed for pain.    . Multiple Vitamin (MULTIVITAMIN WITH MINERALS) TABS tablet Take 1 tablet by mouth daily.    . phentermine (ADIPEX-P) 37.5 MG tablet Take 1/2 to 1 tablet every morning for dieting & weightloss 30 tablet 2  . triamterene-hydrochlorothiazide (MAXZIDE) 75-50 MG tablet Take 1 tablet every Morning for BP & Fluid Retention / Ankle Swelling 90 tablet 0   No current facility-administered medications on file prior to visit.   Health Maintenance:   Immunization History  Administered Date(s) Administered  . PFIZER SARS-COV-2 Vaccination 07/03/2019, 07/26/2019  . PPD Test 11/21/2016  . Td 02/05/2012     TD/TDAP: 2015 Influenza: Declines Pneumovax: n/a Prevnar 13: n/a HPV vaccines: n/a  LMP: No LMP recorded.  Sexually Active: yes, STD screening declined today will get at GYN Pap: DUE, Dr Conni Elliot [facility]: Bilateral diagnostic 2013 related to L breast lump  Allergies: No Known Allergies Medical History:  has Eczema; Body mass index  (BMI) of 40.0 to 44.9 in adult [facility]); Vitamin D deficiency; Essential hypertension; B12 deficiency; and Fatigue on their problem list. Surgical History:  She  has a past surgical history that includes Cyst removal trunk (lower back); Pilonidal cyst excision (2003); ORIF ankle fracture (Right, 11/14/2017); Breast biopsy (Left, 2010); and Knee arthroscopy (Right, 03/12/2018). Family History:  Her She was adopted. Family history is unknown by patient. Social History:   reports that she quit smoking about 16 years ago. Her smoking use included cigarettes. She smoked 0.50 packs per day. She has never used smokeless tobacco. She reports current alcohol use. She reports that she does not use drugs.   Review of Systems: Review of Systems  Constitutional: Negative for chills, diaphoresis, fever, malaise/fatigue and weight loss.  HENT: Negative for congestion, ear discharge, ear pain, hearing loss, nosebleeds, sinus pain, sore throat and tinnitus.   Eyes: Negative for blurred vision, double vision,  photophobia, pain, discharge and redness.  Respiratory: Negative for cough, hemoptysis, sputum production, shortness of breath, wheezing and stridor.   Cardiovascular: Negative for chest pain, palpitations, orthopnea, claudication, leg swelling and PND.  Gastrointestinal: Negative for abdominal pain, blood in stool, constipation, diarrhea, heartburn, melena, nausea and vomiting.  Genitourinary: Negative for dysuria, flank pain, frequency, hematuria and urgency.  Musculoskeletal: Negative for back pain, falls, joint pain, myalgias and neck pain.  Skin: Negative for itching and rash.  Neurological: Negative for dizziness, tingling, tremors, sensory change, speech change, focal weakness, seizures, loss of consciousness, weakness and headaches.  Endo/Heme/Allergies: Negative for environmental allergies and polydipsia. Does not bruise/bleed easily.  Psychiatric/Behavioral: Negative for depression, hallucinations,  memory loss, substance abuse and suicidal ideas. The patient is not nervous/anxious and does not have insomnia.     Physical Exam: Estimated body mass index is 41.43 kg/m as calculated from the following:   Height as of 06/04/18: 5' 2.75" (1.594 m).   Weight as of 09/03/18: 232 lb (105.2 kg).  General Appearance: Well nourished, overweight female in no apparent distress.  Eyes: PERRLA, EOMs, conjunctiva no swelling or erythema, normal fundi and vessels.  Sinuses: No Frontal/maxillary tenderness  ENT/Mouth: Ext aud canals clear, normal light reflex with TMs without erythema, bulging. Good dentition. No erythema, swelling, or exudate on post pharynx. Tonsils not swollen or erythematous. Hearing normal.  Neck: Supple, thyroid normal. No bruits  Respiratory: Respiratory effort normal, BS equal bilaterally without rales, rhonchi, wheezing or stridor.  Cardio: RRR without murmurs, rubs or gallops. Brisk peripheral pulses without edema.  Chest: symmetric, with normal excursions and percussion.  Abdomen: Soft, nontender, no guarding, rebound, hernias, masses, or organomegaly.  Lymphatics: Non tender without lymphadenopathy.  Musculoskeletal: Full ROM all peripheral extremities,5/5 strength, and normal gait. Rankle in brace.  No obvious deformity, laxity, effusion.  Skin: Warm, dry without rashes, lesions, ecchymosis. Neuro: Cranial nerves intact, reflexes equal bilaterally. Normal muscle tone, no cerebellar symptoms. Sensation intact.  Psych: Awake and oriented X 3, normal affect, Insight and Judgment appropriate.      Elder Negus, Edrick Oh, DNP [facility] 04/05/2020  17:43 PM

## 2020-04-06 ENCOUNTER — Other Ambulatory Visit: Payer: Self-pay | Admitting: Adult Health Nurse Practitioner

## 2020-04-06 DIAGNOSIS — R7989 Other specified abnormal findings of blood chemistry: Secondary | ICD-10-CM

## 2020-04-06 DIAGNOSIS — E538 Deficiency of other specified B group vitamins: Secondary | ICD-10-CM

## 2020-04-06 MED ORDER — CHOLECALCIFEROL 1.25 MG (50000 UT) PO CAPS: ORAL_CAPSULE | ORAL | 0 refills | Status: DC

## 2020-04-06 MED ORDER — CYANOCOBALAMIN 1000 MCG/ML IJ SOLN: 1000.0000 ug | INTRAMUSCULAR | 6 refills | Status: DC

## 2020-04-17 ENCOUNTER — Ambulatory Visit: Payer: Self-pay | Admitting: Adult Health

## 2020-05-08 ENCOUNTER — Ambulatory Visit: Payer: BC Managed Care – PPO | Admitting: Adult Health Nurse Practitioner

## 2020-06-26 ENCOUNTER — Other Ambulatory Visit: Payer: Self-pay | Admitting: Adult Health Nurse Practitioner

## 2020-06-26 DIAGNOSIS — R7989 Other specified abnormal findings of blood chemistry: Secondary | ICD-10-CM

## 2020-08-16 ENCOUNTER — Other Ambulatory Visit: Payer: Self-pay | Admitting: Adult Health Nurse Practitioner

## 2020-08-16 DIAGNOSIS — R7989 Other specified abnormal findings of blood chemistry: Secondary | ICD-10-CM

## 2020-11-22 ENCOUNTER — Encounter: Payer: BC Managed Care – PPO | Admitting: Adult Health Nurse Practitioner

## 2020-12-04 ENCOUNTER — Encounter: Payer: BC Managed Care – PPO | Admitting: Adult Health Nurse Practitioner

## 2023-08-26 ENCOUNTER — Ambulatory Visit: Payer: Self-pay | Admitting: General Surgery

## 2023-08-26 NOTE — H&P (Signed)
   REFERRING PHYSICIAN: Self PROVIDER: RICHERD SILVERSMITH, MD MRN: [id number] DOB: 1985/02/16 DATE OF ENCOUNTER: 08/05/2023 Subjective   Reason for Referral: symptomatic cholelithiasis History of Present Illness: Adriana Carr is a 38 y.o. female who is seen today as an office consultation for evaluation of cholelithiasis  Patient has had several gallbladder attacks over the past year. First started last year x 1 then has had 3 episodes since April. No fevers/chills. Does report nausea and emesis. No dysuria, no changes in color of urine, occasional changes in color of stool to light green with these episodes. Last two attacks have been milder. They are associated with fatty foods.  Review of Systems: A complete review of systems was obtained from the patient. I have reviewed this information and discussed as appropriate with the patient. ROS otherwise negative except as noted in HPI.  Medical History: History reviewed. No pertinent past medical history.  Past Surgical History:  Procedure Laterality Date  ankle reconstruction  Knee Surgery    No Known Allergies  No current outpatient medications on file prior to visit.   No current facility-administered medications on file prior to visit.   History reviewed. No pertinent family history.   Social History   Tobacco Use  Smoking Status Former  Types: Cigarettes  Smokeless Tobacco Never    Objective:   Vitals:  08/05/23 1117 08/05/23 1118  BP: 133/85  Pulse: 75  Temp: 36.8 C (98.2 F)  Weight: (!) 118.1 kg (260 lb 6.4 oz)  Height: 158.8 cm (5' 2.5)  PainSc: 0-No pain  PainLoc: Abdomen   Body mass index is 46.87 kg/m.  Physical Exam: General: No acute distress, well appearing HEENT: PERRL, hearing grossly normal, mucous membranes moist CV: Regular rate and rhythm Pulm: Normal work of breathing on room air Abd: Soft, nontender, nondistended Extremities: Warm and well perfused Neuro: A&O x4, no focal neurologic  deficits Psych: Appropriate mood and effect  Labs, Imaging and Diagnostic Testing: Unable to review US  images, but radiologist read reviewed. US  RUQ 06/11/23: Cholelithiasis without pericholecystic fluid or GB wall thickening. I have personally reviewed any pertinent labs--normal LFTs, normal CBC I have personally reviewed referral notes from PCP.  Assessment and Plan:   Calculus of gallbladder without cholecystitis without obstruction  - Proceed with elective laparoscopic cholecystectomy with ICG - We discussed the etiology of patient's pain, we discussed treatment options and recommended surgery. We discussed details of surgery including general anesthesia, laparoscopic approach, identification of cystic duct and common bile duct. Ligation of cystic duct and cystic artery. Possible need for intraoperative cholangiogram, open procedure, and subtotal cholecystectomy. Possible risks of common bile duct injury, injury to surrounding structures, bile leak, bleeding, infection, diarrhea, retained stone and hernia. The patient showed good understanding and all questions were answered.   Orie SILVERSMITH, MD [facility] Surgery

## 2023-08-26 NOTE — H&P (View-Only) (Signed)
   REFERRING PHYSICIAN: Self PROVIDER: RICHERD SILVERSMITH, MD MRN: [id number] DOB: 1985/02/16 DATE OF ENCOUNTER: 08/05/2023 Subjective   Reason for Referral: symptomatic cholelithiasis History of Present Illness: Adriana Carr is a 38 y.o. female who is seen today as an office consultation for evaluation of cholelithiasis  Patient has had several gallbladder attacks over the past year. First started last year x 1 then has had 3 episodes since April. No fevers/chills. Does report nausea and emesis. No dysuria, no changes in color of urine, occasional changes in color of stool to light green with these episodes. Last two attacks have been milder. They are associated with fatty foods.  Review of Systems: A complete review of systems was obtained from the patient. I have reviewed this information and discussed as appropriate with the patient. ROS otherwise negative except as noted in HPI.  Medical History: History reviewed. No pertinent past medical history.  Past Surgical History:  Procedure Laterality Date  ankle reconstruction  Knee Surgery    No Known Allergies  No current outpatient medications on file prior to visit.   No current facility-administered medications on file prior to visit.   History reviewed. No pertinent family history.   Social History   Tobacco Use  Smoking Status Former  Types: Cigarettes  Smokeless Tobacco Never    Objective:   Vitals:  08/05/23 1117 08/05/23 1118  BP: 133/85  Pulse: 75  Temp: 36.8 C (98.2 F)  Weight: (!) 118.1 kg (260 lb 6.4 oz)  Height: 158.8 cm (5' 2.5)  PainSc: 0-No pain  PainLoc: Abdomen   Body mass index is 46.87 kg/m.  Physical Exam: General: No acute distress, well appearing HEENT: PERRL, hearing grossly normal, mucous membranes moist CV: Regular rate and rhythm Pulm: Normal work of breathing on room air Abd: Soft, nontender, nondistended Extremities: Warm and well perfused Neuro: A&O x4, no focal neurologic  deficits Psych: Appropriate mood and effect  Labs, Imaging and Diagnostic Testing: Unable to review US  images, but radiologist read reviewed. US  RUQ 06/11/23: Cholelithiasis without pericholecystic fluid or GB wall thickening. I have personally reviewed any pertinent labs--normal LFTs, normal CBC I have personally reviewed referral notes from PCP.  Assessment and Plan:   Calculus of gallbladder without cholecystitis without obstruction  - Proceed with elective laparoscopic cholecystectomy with ICG - We discussed the etiology of patient's pain, we discussed treatment options and recommended surgery. We discussed details of surgery including general anesthesia, laparoscopic approach, identification of cystic duct and common bile duct. Ligation of cystic duct and cystic artery. Possible need for intraoperative cholangiogram, open procedure, and subtotal cholecystectomy. Possible risks of common bile duct injury, injury to surrounding structures, bile leak, bleeding, infection, diarrhea, retained stone and hernia. The patient showed good understanding and all questions were answered.   Orie SILVERSMITH, MD [facility] Surgery

## 2023-08-26 NOTE — Pre-Procedure Instructions (Signed)
 Surgical Instructions   Your procedure is scheduled on Thursday, July 31st. Report to [facility] Main Entrance A at 05:30 A.M., then check in with the Admitting office. Any questions or running late day of surgery: call [phone]  Questions prior to your surgery date: call [phone], Monday-Friday, 8am-4pm. If you experience any cold or flu symptoms such as cough, fever, chills, shortness of breath, etc. between now and your scheduled surgery, please notify us  at the above number.     Remember:  Do not eat after midnight the night before your surgery   You may drink clear liquids until 04:30 AM the morning of your surgery.   Clear liquids allowed are: Water, Non-Citrus Juices (without pulp), Carbonated Beverages, Clear Tea (no milk, honey, etc.), Black Coffee Only (NO MILK, CREAM OR POWDERED CREAMER of any kind), and Gatorade.    Take these medicines the morning of surgery with A SIP OF WATER  May take these medicines IF NEEDED: acetaminophen  (TYLENOL )    One week prior to surgery, STOP taking any Aspirin  (unless otherwise instructed by your surgeon) Aleve , Naproxen , Ibuprofen , Motrin , Advil , Goody's, BC's, all herbal medications, fish oil, and non-prescription vitamins.                     Do NOT Smoke (Tobacco/Vaping) for 24 hours prior to your procedure.  If you use a CPAP at night, you may bring your mask/headgear for your overnight stay.   You will be asked to remove any contacts, glasses, piercing's, hearing aid's, dentures/partials prior to surgery. Please bring cases for these items if needed.    Patients discharged the day of surgery will not be allowed to drive home, and someone needs to stay with them for 24 hours.  SURGICAL WAITING ROOM VISITATION Patients may have no more than 2 support people in the waiting area - these visitors may rotate.   Pre-op nurse will coordinate an appropriate time for 1 ADULT support person, who may not rotate, to accompany patient  in pre-op.  Children under the age of 75 must have an adult with them who is not the patient and must remain in the main waiting area with an adult.  If the patient needs to stay at the hospital during part of their recovery, the visitor guidelines for inpatient rooms apply.  Please refer to the [facility] website for the visitor guidelines for any additional information.   If you received a COVID test during your pre-op visit  it is requested that you wear a mask when out in public, stay away from anyone that may not be feeling well and notify your surgeon if you develop symptoms. If you have been in contact with anyone that has tested positive in the last 10 days please notify you surgeon.      Pre-operative CHG Bathing Instructions   You can play a key role in reducing the risk of infection after surgery. Your skin needs to be as free of germs as possible. You can reduce the number of germs on your skin by washing with CHG (chlorhexidine  gluconate) soap before surgery. CHG is an antiseptic soap that kills germs and continues to kill germs even after washing.   DO NOT use if you have an allergy to chlorhexidine /CHG or antibacterial soaps. If your skin becomes reddened or irritated, stop using the CHG and notify one of our RNs at [phone].              TAKE A SHOWER  THE NIGHT BEFORE SURGERY AND THE DAY OF SURGERY    Please keep in mind the following:  DO NOT shave, including legs and underarms, 48 hours prior to surgery.   You may shave your face before/day of surgery.  Place clean sheets on your bed the night before surgery Use a clean washcloth (not used since being washed) for each shower. DO NOT sleep with pet's night before surgery.  CHG Shower Instructions:  Wash your face and private area with normal soap. If you choose to wash your hair, wash first with your normal shampoo.  After you use shampoo/soap, rinse your hair and body thoroughly to remove shampoo/soap residue.   Turn the water OFF and apply half the bottle of CHG soap to a CLEAN washcloth.  Apply CHG soap ONLY FROM YOUR NECK DOWN TO YOUR TOES (washing for 3-5 minutes)  DO NOT use CHG soap on face, private areas, open wounds, or sores.  Pay special attention to the area where your surgery is being performed.  If you are having back surgery, having someone wash your back for you may be helpful. Wait 2 minutes after CHG soap is applied, then you may rinse off the CHG soap.  Pat dry with a clean towel  Put on clean pajamas    Additional instructions for the day of surgery: DO NOT APPLY any lotions, deodorants, cologne, or perfumes.   Do not wear jewelry or makeup Do not wear nail polish, gel polish, artificial nails, or any other type of covering on natural nails (fingers and toes) Do not bring valuables to the hospital. [facility] is not responsible for valuables/personal belongings. Put on clean/comfortable clothes.  Please brush your teeth.  Ask your nurse before applying any prescription medications to the skin.

## 2023-08-27 ENCOUNTER — Ambulatory Visit: Payer: Self-pay | Admitting: General Surgery

## 2023-08-27 ENCOUNTER — Other Ambulatory Visit: Payer: Self-pay

## 2023-08-27 ENCOUNTER — Encounter (HOSPITAL_COMMUNITY)
Admission: RE | Admit: 2023-08-27 | Discharge: 2023-08-27 | Disposition: A | Discharge status: Home / Self Care | Payer: Self-pay | Source: Ambulatory Visit | Attending: General Surgery | Admitting: General Surgery

## 2023-08-27 ENCOUNTER — Encounter (HOSPITAL_COMMUNITY): Payer: Self-pay

## 2023-08-27 VITALS — BP 110/75 | HR 78 | Temp 98.6°F | Resp 18 | Ht 63.0 in | Wt 259.0 lb

## 2023-08-27 DIAGNOSIS — Z01818 Encounter for other preprocedural examination: Secondary | ICD-10-CM | POA: Insufficient documentation

## 2023-08-27 LAB — BASIC METABOLIC PANEL WITH GFR
Anion gap: 8 (ref 5–15)
BUN: 5 mg/dL — ABNORMAL LOW (ref 6–20)
CO2: 25 mmol/L (ref 22–32)
Calcium: 9.4 mg/dL (ref 8.9–10.3)
Chloride: 102 mmol/L (ref 98–111)
Creatinine, Ser: 0.71 mg/dL (ref 0.44–1.00)
GFR, Estimated: >60 mL/min (ref >60)
Glucose, Bld: 92 mg/dL (ref 70–99)
Potassium: 3.5 mmol/L (ref 3.5–5.1)
Sodium: 135 mmol/L (ref 135–145)

## 2023-08-27 LAB — CBC
HCT: 37.8 % (ref 36.0–46.0)
Hemoglobin: 12 g/dL (ref 12.0–15.0)
MCH: 26.8 pg (ref 26.0–34.0)
MCHC: 31.7 g/dL (ref 30.0–36.0)
MCV: 84.4 fL (ref 80.0–100.0)
Platelets: 407 K/uL — ABNORMAL HIGH (ref 150–400)
RBC: 4.48 MIL/uL (ref 3.87–5.11)
RDW: 13.6 % (ref 11.5–15.5)
WBC: 10.3 K/uL (ref 4.0–10.5)
nRBC: 0 % (ref 0.0–0.2)

## 2023-08-27 NOTE — Progress Notes (Signed)
 PCP - Lonell Collet Cardiologist -   PPM/ICD - denies Device Orders - n/a Rep Notified - n/a  Chest x-ray - denies EKG - 08-27-23 Stress Test - denies ECHO - denies Cardiac Cath - denies  Sleep Study - denies CPAP - n/a  Dm -denies  Blood Thinner Instructions:denies Aspirin  Instructions:n/a  ERAS Protcol - clear liquids until 4:30    COVID TEST- n/a   Anesthesia review: no  Patient denies shortness of breath, fever, cough and chest pain at PAT appointment   All instructions explained to the patient, with a verbal understanding of the material. Patient agrees to go over the instructions while at home for a better understanding. Patient also instructed to self quarantine after being tested for COVID-19. The opportunity to ask questions was provided.

## 2023-09-03 NOTE — Anesthesia Preprocedure Evaluation (Signed)
 Anesthesia Evaluation  Patient identified by MRN, date of birth, ID band Patient awake    Reviewed: Allergy & Precautions, NPO status , Patient's Chart, lab work & pertinent test results  Airway Mallampati: I  TM Distance: >3 FB     Dental no notable dental hx. (+) Teeth Intact, Dental Advisory Given   Pulmonary former smoker   Pulmonary exam normal breath sounds clear to auscultation       Cardiovascular hypertension, Normal cardiovascular exam Rhythm:Regular Rate:Normal     Neuro/Psych negative neurological ROS  negative psych ROS   GI/Hepatic negative GI ROS, Neg liver ROS,,,Cholelithiasis - symptomatic   Endo/Other  negative endocrine ROS    Renal/GU negative Renal ROS  negative genitourinary   Musculoskeletal negative musculoskeletal ROS (+)    Abdominal  (+) + obese  Peds  Hematology negative hematology ROS (+)   Anesthesia Other Findings   Reproductive/Obstetrics                              Anesthesia Physical Anesthesia Plan  ASA: 2  Anesthesia Plan:    Post-op Pain Management: Dilaudid  IV, Precedex  and Tylenol  PO (pre-op)*   Induction: Intravenous and Cricoid pressure planned  PONV Risk Score and Plan: 4 or greater and Treatment may vary due to age or medical condition, Midazolam , Ondansetron  and Dexamethasone   Airway Management Planned: Oral ETT  Additional Equipment: None  Intra-op Plan:   Post-operative Plan: Extubation in OR  Informed Consent: I have reviewed the patients History and Physical, chart, labs and discussed the procedure including the risks, benefits and alternatives for the proposed anesthesia with the patient or authorized representative who has indicated his/her understanding and acceptance.     Dental advisory given  Plan Discussed with: CRNA and Anesthesiologist  Anesthesia Plan Comments:          Anesthesia Quick Evaluation

## 2023-09-04 ENCOUNTER — Encounter (HOSPITAL_COMMUNITY): Payer: Self-pay | Admitting: General Surgery

## 2023-09-04 ENCOUNTER — Ambulatory Visit (HOSPITAL_COMMUNITY)
Admission: RE | Admit: 2023-09-04 | Discharge: 2023-09-04 | Disposition: A | Discharge status: Home / Self Care | Payer: Self-pay | Attending: General Surgery | Admitting: General Surgery

## 2023-09-04 ENCOUNTER — Ambulatory Visit (HOSPITAL_BASED_OUTPATIENT_CLINIC_OR_DEPARTMENT_OTHER): Payer: Self-pay | Admitting: Certified Registered Nurse Anesthetist

## 2023-09-04 ENCOUNTER — Other Ambulatory Visit: Payer: Self-pay

## 2023-09-04 ENCOUNTER — Encounter (HOSPITAL_COMMUNITY)
Admission: RE | Disposition: A | Discharge status: Home / Self Care | Payer: Self-pay | Source: Home / Self Care | Attending: General Surgery

## 2023-09-04 ENCOUNTER — Ambulatory Visit (HOSPITAL_COMMUNITY): Payer: Self-pay | Admitting: Certified Registered Nurse Anesthetist

## 2023-09-04 DIAGNOSIS — Z6841 Body Mass Index (BMI) 40.0 and over, adult: Secondary | ICD-10-CM | POA: Diagnosis not present

## 2023-09-04 DIAGNOSIS — I10 Essential (primary) hypertension: Secondary | ICD-10-CM | POA: Diagnosis not present

## 2023-09-04 DIAGNOSIS — K8010 Calculus of gallbladder with chronic cholecystitis without obstruction: Secondary | ICD-10-CM | POA: Diagnosis not present

## 2023-09-04 DIAGNOSIS — K8020 Calculus of gallbladder without cholecystitis without obstruction: Secondary | ICD-10-CM | POA: Diagnosis present

## 2023-09-04 DIAGNOSIS — E669 Obesity, unspecified: Secondary | ICD-10-CM | POA: Insufficient documentation

## 2023-09-04 DIAGNOSIS — Z87891 Personal history of nicotine dependence: Secondary | ICD-10-CM | POA: Diagnosis not present

## 2023-09-04 HISTORY — PX: CHOLECYSTECTOMY: SHX55

## 2023-09-04 LAB — POCT PREGNANCY, URINE: Preg Test, Ur: NEGATIVE

## 2023-09-04 SURGERY — LAPAROSCOPIC CHOLECYSTECTOMY
Anesthesia: General | Site: Abdomen

## 2023-09-04 MED ORDER — BUPIVACAINE-EPINEPHRINE 0.25% -1:200000 IJ SOLN
INTRAMUSCULAR | Status: DC | PRN
  Administered 2023-09-04: 30 mL

## 2023-09-04 MED ORDER — ONDANSETRON HCL 4 MG/2ML IJ SOLN
INTRAMUSCULAR | Status: DC | PRN
  Administered 2023-09-04: 4 mg via INTRAVENOUS

## 2023-09-04 MED ORDER — HYDROMORPHONE HCL 1 MG/ML IJ SOLN
INTRAMUSCULAR | Status: AC
  Filled 2023-09-04: qty 0.5

## 2023-09-04 MED ORDER — SODIUM CHLORIDE 0.9 % IR SOLN
Status: DC | PRN
  Administered 2023-09-04: 700 mL

## 2023-09-04 MED ORDER — KETOROLAC TROMETHAMINE 15 MG/ML IJ SOLN
INTRAMUSCULAR | Status: DC | PRN
  Administered 2023-09-04: 15 mg via INTRAVENOUS

## 2023-09-04 MED ORDER — CHLORHEXIDINE GLUCONATE CLOTH 2 % EX PADS: 6.0000 | MEDICATED_PAD | Freq: Once | CUTANEOUS | Status: DC

## 2023-09-04 MED ORDER — CEFAZOLIN SODIUM-DEXTROSE 2-4 GM/100ML-% IV SOLN
2.0000 g | INTRAVENOUS | Status: AC
  Administered 2023-09-04: 2 g via INTRAVENOUS
  Filled 2023-09-04: qty 100

## 2023-09-04 MED ORDER — BUPIVACAINE-EPINEPHRINE (PF) 0.25% -1:200000 IJ SOLN
INTRAMUSCULAR | Status: AC
  Filled 2023-09-04: qty 30

## 2023-09-04 MED ORDER — INDOCYANINE GREEN 25 MG IV SOLR
1.2500 mg | Freq: Once | INTRAVENOUS | Status: AC
  Administered 2023-09-04: 1.25 mg via INTRAVENOUS
  Filled 2023-09-04: qty 10

## 2023-09-04 MED ORDER — PHENYLEPHRINE HCL-NACL 20-0.9 MG/250ML-% IV SOLN
INTRAVENOUS | Status: AC
Start: 2023-09-04 — End: 2023-09-04
  Filled 2023-09-04: qty 750

## 2023-09-04 MED ORDER — SUGAMMADEX SODIUM 200 MG/2ML IV SOLN
INTRAVENOUS | Status: DC | PRN
  Administered 2023-09-04: 200 mg via INTRAVENOUS

## 2023-09-04 MED ORDER — OXYCODONE HCL 5 MG PO TABS
5.0000 mg | ORAL_TABLET | Freq: Once | ORAL | Status: AC | PRN
  Administered 2023-09-04: 5 mg via ORAL

## 2023-09-04 MED ORDER — PROPOFOL 10 MG/ML IV BOLUS
INTRAVENOUS | Status: AC
  Filled 2023-09-04: qty 20

## 2023-09-04 MED ORDER — GABAPENTIN 300 MG PO CAPS
300.0000 mg | ORAL_CAPSULE | ORAL | Status: AC
  Administered 2023-09-04: 300 mg via ORAL
  Filled 2023-09-04: qty 1

## 2023-09-04 MED ORDER — ACETAMINOPHEN 500 MG PO TABS
1000.0000 mg | ORAL_TABLET | ORAL | Status: AC
  Administered 2023-09-04: 1000 mg via ORAL
  Filled 2023-09-04: qty 2

## 2023-09-04 MED ORDER — DROPERIDOL 2.5 MG/ML IJ SOLN: 0.6250 mg | Freq: Once | INTRAMUSCULAR | Status: DC | PRN

## 2023-09-04 MED ORDER — ENOXAPARIN SODIUM 40 MG/0.4ML IJ SOSY
40.0000 mg | PREFILLED_SYRINGE | Freq: Once | INTRAMUSCULAR | Status: AC
  Administered 2023-09-04: 40 mg via SUBCUTANEOUS
  Filled 2023-09-04: qty 0.4

## 2023-09-04 MED ORDER — PROPOFOL 10 MG/ML IV BOLUS
INTRAVENOUS | Status: DC | PRN
  Administered 2023-09-04: 200 mg via INTRAVENOUS

## 2023-09-04 MED ORDER — OXYCODONE HCL 5 MG/5ML PO SOLN: 5.0000 mg | Freq: Once | ORAL | Status: AC | PRN

## 2023-09-04 MED ORDER — ONDANSETRON HCL 4 MG/2ML IJ SOLN
INTRAMUSCULAR | Status: AC
Start: 2023-09-04 — End: 2023-09-04
  Filled 2023-09-04: qty 2

## 2023-09-04 MED ORDER — HYDROMORPHONE HCL 1 MG/ML IJ SOLN
INTRAMUSCULAR | Status: DC | PRN
  Administered 2023-09-04 (×2): .5 mg via INTRAVENOUS

## 2023-09-04 MED ORDER — KETOROLAC TROMETHAMINE 30 MG/ML IJ SOLN
INTRAMUSCULAR | Status: AC
  Filled 2023-09-04: qty 1

## 2023-09-04 MED ORDER — DEXAMETHASONE SODIUM PHOSPHATE 10 MG/ML IJ SOLN
INTRAMUSCULAR | Status: AC
Start: 2023-09-04 — End: 2023-09-04
  Filled 2023-09-04: qty 1

## 2023-09-04 MED ORDER — FENTANYL CITRATE (PF) 100 MCG/2ML IJ SOLN
INTRAMUSCULAR | Status: AC
  Filled 2023-09-04: qty 2

## 2023-09-04 MED ORDER — MIDAZOLAM HCL 2 MG/2ML IJ SOLN
INTRAMUSCULAR | Status: DC | PRN
  Administered 2023-09-04: 2 mg via INTRAVENOUS

## 2023-09-04 MED ORDER — SUGAMMADEX SODIUM 200 MG/2ML IV SOLN
INTRAVENOUS | Status: AC
  Filled 2023-09-04: qty 2

## 2023-09-04 MED ORDER — PROPOFOL 1000 MG/100ML IV EMUL
INTRAVENOUS | Status: AC
  Filled 2023-09-04: qty 300

## 2023-09-04 MED ORDER — HYDROMORPHONE HCL 1 MG/ML IJ SOLN: 0.2500 mg | INTRAMUSCULAR | Status: DC | PRN

## 2023-09-04 MED ORDER — MIDAZOLAM HCL 2 MG/2ML IJ SOLN
INTRAMUSCULAR | Status: AC
  Filled 2023-09-04: qty 2

## 2023-09-04 MED ORDER — OXYCODONE HCL 5 MG PO TABS: 5.0000 mg | ORAL_TABLET | ORAL | 0 refills | Status: AC | PRN

## 2023-09-04 MED ORDER — DEXAMETHASONE SODIUM PHOSPHATE 10 MG/ML IJ SOLN
INTRAMUSCULAR | Status: DC | PRN
  Administered 2023-09-04: 8 mg via INTRAVENOUS

## 2023-09-04 MED ORDER — OXYCODONE HCL 5 MG PO TABS
ORAL_TABLET | ORAL | Status: AC
  Filled 2023-09-04: qty 1

## 2023-09-04 MED ORDER — FENTANYL CITRATE (PF) 250 MCG/5ML IJ SOLN
INTRAMUSCULAR | Status: DC | PRN
  Administered 2023-09-04 (×2): 50 ug via INTRAVENOUS

## 2023-09-04 MED ORDER — LIDOCAINE 2% (20 MG/ML) 5 ML SYRINGE
INTRAMUSCULAR | Status: DC | PRN
  Administered 2023-09-04: 100 mg via INTRAVENOUS

## 2023-09-04 MED ORDER — CHLORHEXIDINE GLUCONATE 0.12 % MT SOLN
15.0000 mL | Freq: Once | OROMUCOSAL | Status: AC
  Administered 2023-09-04: 15 mL via OROMUCOSAL
  Filled 2023-09-04: qty 15

## 2023-09-04 MED ORDER — DEXMEDETOMIDINE HCL IN NACL 80 MCG/20ML IV SOLN
INTRAVENOUS | Status: DC | PRN
  Administered 2023-09-04: 20 ug via INTRAVENOUS

## 2023-09-04 MED ORDER — ROCURONIUM BROMIDE 10 MG/ML (PF) SYRINGE
PREFILLED_SYRINGE | INTRAVENOUS | Status: DC | PRN
  Administered 2023-09-04: 50 mg via INTRAVENOUS
  Administered 2023-09-04: 10 mg via INTRAVENOUS

## 2023-09-04 MED ORDER — CELECOXIB 200 MG PO CAPS
200.0000 mg | ORAL_CAPSULE | ORAL | Status: AC
  Administered 2023-09-04: 200 mg via ORAL
  Filled 2023-09-04: qty 1

## 2023-09-04 MED ORDER — SCOPOLAMINE 1 MG/3DAYS TD PT72
1.0000 | MEDICATED_PATCH | TRANSDERMAL | Status: DC
  Administered 2023-09-04: 1.5 mg via TRANSDERMAL
  Filled 2023-09-04 (×2): qty 1

## 2023-09-04 MED ORDER — PHENYLEPHRINE 80 MCG/ML (10ML) SYRINGE FOR IV PUSH (FOR BLOOD PRESSURE SUPPORT)
PREFILLED_SYRINGE | INTRAVENOUS | Status: DC | PRN
  Administered 2023-09-04: 80 ug via INTRAVENOUS

## 2023-09-04 MED ORDER — LACTATED RINGERS IV SOLN: INTRAVENOUS | Status: DC

## 2023-09-04 MED ORDER — 0.9 % SODIUM CHLORIDE (POUR BTL) OPTIME
TOPICAL | Status: DC | PRN
Start: 2023-09-04 — End: 2023-09-04
  Administered 2023-09-04: 650 mL

## 2023-09-04 MED ORDER — ONDANSETRON HCL 4 MG/2ML IJ SOLN: 4.0000 mg | Freq: Once | INTRAMUSCULAR | Status: DC | PRN

## 2023-09-04 MED ORDER — ORAL CARE MOUTH RINSE
15.0000 mL | Freq: Once | OROMUCOSAL | Status: AC
Start: 2023-09-04 — End: 2023-09-04

## 2023-09-04 MED ORDER — LIDOCAINE 2% (20 MG/ML) 5 ML SYRINGE
INTRAMUSCULAR | Status: AC
Start: 2023-09-04 — End: 2023-09-04
  Filled 2023-09-04: qty 5

## 2023-09-04 SURGICAL SUPPLY — 43 items
BAG COUNTER SPONGE SURGICOUNT (BAG) ×2 IMPLANT
BLADE CLIPPER SURG (BLADE) IMPLANT
CANISTER SUCTION 3000ML PPV (SUCTIONS) ×2 IMPLANT
CHLORAPREP W/TINT 26 (MISCELLANEOUS) ×2 IMPLANT
CLIP APPLIE ROT 10 11.4 M/L (STAPLE) IMPLANT
CLIP LIGATING HEMO O LOK GREEN (MISCELLANEOUS) ×2 IMPLANT
CNTNR URN SCR LID CUP LEK RST (MISCELLANEOUS) ×2 IMPLANT
COVER SURGICAL LIGHT HANDLE (MISCELLANEOUS) ×2 IMPLANT
DERMABOND ADVANCED .7 DNX12 (GAUZE/BANDAGES/DRESSINGS) ×2 IMPLANT
ELECTRODE REM PT RTRN 9FT ADLT (ELECTROSURGICAL) ×2 IMPLANT
GLOVE BIOGEL PI IND STRL 7.0 (GLOVE) IMPLANT
GLOVE BIOGEL PI MICRO STRL 6 (GLOVE) ×2 IMPLANT
GLOVE INDICATOR 6.5 STRL GRN (GLOVE) ×2 IMPLANT
GLOVE SURG SS PI 6.5 STRL IVOR (GLOVE) IMPLANT
GOWN STRL REUS W/ TWL LRG LVL3 (GOWN DISPOSABLE) ×6 IMPLANT
GRASPER SUT TROCAR 14GX15 (MISCELLANEOUS) ×2 IMPLANT
IRRIGATION SUCT STRKRFLW 2 WTP (MISCELLANEOUS) ×2 IMPLANT
KIT BASIN OR (CUSTOM PROCEDURE TRAY) ×2 IMPLANT
KIT IMAGING PINPOINTPAQ (MISCELLANEOUS) IMPLANT
KIT TURNOVER KIT B (KITS) ×2 IMPLANT
LHOOK LAP DISP 36CM (ELECTROSURGICAL) ×2 IMPLANT
NDL 22X1.5 STRL (OR ONLY) (MISCELLANEOUS) ×2 IMPLANT
NDL INSUFFLATION 14GA 120MM (NEEDLE) ×2 IMPLANT
NEEDLE 22X1.5 STRL (OR ONLY) (MISCELLANEOUS) ×1 IMPLANT
NEEDLE INSUFFLATION 14GA 120MM (NEEDLE) ×1 IMPLANT
NS IRRIG 1000ML POUR BTL (IV SOLUTION) ×2 IMPLANT
PAD ARMBOARD POSITIONER FOAM (MISCELLANEOUS) ×2 IMPLANT
PENCIL BUTTON HOLSTER BLD 10FT (ELECTRODE) ×2 IMPLANT
POUCH LAPAROSCOPIC INSTRUMENT (MISCELLANEOUS) ×2 IMPLANT
SCISSORS LAP 5X35 DISP (ENDOMECHANICALS) ×2 IMPLANT
SET TUBE SMOKE EVAC HIGH FLOW (TUBING) ×2 IMPLANT
SLEEVE Z-THREAD 5X100MM (TROCAR) ×4 IMPLANT
SPECIMEN JAR SMALL (MISCELLANEOUS) ×2 IMPLANT
SUT MNCRL AB 4-0 PS2 18 (SUTURE) ×2 IMPLANT
SUT VICRYL 0 UR6 27IN ABS (SUTURE) IMPLANT
SYSTEM BAG RETRIEVAL 10MM (BASKET) ×2 IMPLANT
TOWEL GREEN STERILE (TOWEL DISPOSABLE) IMPLANT
TOWEL GREEN STERILE FF (TOWEL DISPOSABLE) ×2 IMPLANT
TRAY LAPAROSCOPIC MC (CUSTOM PROCEDURE TRAY) ×2 IMPLANT
TROCAR Z THREAD OPTICAL 12X100 (TROCAR) ×2 IMPLANT
TROCAR Z-THREAD OPTICAL 5X100M (TROCAR) ×2 IMPLANT
WARMER LAPAROSCOPE (MISCELLANEOUS) ×2 IMPLANT
WATER STERILE IRR 1000ML POUR (IV SOLUTION) ×2 IMPLANT

## 2023-09-04 NOTE — Anesthesia Postprocedure Evaluation (Signed)
 Anesthesia Post Note  Patient: Adriana Carr  Procedure(s) Performed: LAPAROSCOPIC CHOLECYSTECTOMY (Abdomen)     Patient location during evaluation: PACU Anesthesia Type: General Level of consciousness: awake and alert and oriented Pain management: pain level controlled Vital Signs Assessment: post-procedure vital signs reviewed and stable Respiratory status: spontaneous breathing, nonlabored ventilation and respiratory function stable Cardiovascular status: blood pressure returned to baseline and stable Postop Assessment: no apparent nausea or vomiting Anesthetic complications: no   There were no known notable events for this encounter.  Last Vitals:  Vitals:   09/04/23 0945 09/04/23 0947  BP: (!) 98/53 (!) 98/53  Pulse: 69 71  Resp: 17 16  Temp:  36.5 C  SpO2: 100% 100%    Last Pain:  Vitals:   09/04/23 0917  TempSrc:   PainSc: Asleep                 [name] A.

## 2023-09-04 NOTE — Interval H&P Note (Signed)
 History and Physical Interval Note:  09/04/2023 7:10 AM  Adriana Carr  has presented today for surgery, with the diagnosis of CHOLELITHIASIS.  The various methods of treatment have been discussed with the patient and family. After consideration of risks, benefits and other options for treatment, the patient has consented to  Procedure(s): LAPAROSCOPIC CHOLECYSTECTOMY (N/A) as a surgical intervention.  The patient's history has been reviewed, patient examined, no change in status, stable for surgery.  I have reviewed the patient's chart and labs.  Questions were answered to the patient's satisfaction.     Richerd Silversmith

## 2023-09-04 NOTE — Addendum Note (Signed)
 Addendum  created 09/04/23 1137 by Almetta Toribio LABOR, RN   Flowsheet accepted

## 2023-09-04 NOTE — Transfer of Care (Signed)
 Immediate Anesthesia Transfer of Care Note  Patient: Adriana Carr  Procedure(s) Performed: LAPAROSCOPIC CHOLECYSTECTOMY (Abdomen)  Patient Location: PACU  Anesthesia Type:General  Level of Consciousness: awake and drowsy  Airway & Oxygen Therapy: Patient Spontanous Breathing and Patient connected to face mask oxygen  Post-op Assessment: Report given to RN and Post -op Vital signs reviewed and stable  Post vital signs: Reviewed and stable  Last Vitals:  Vitals Value Taken Time  BP 101/56 09/04/23 09:18  Temp 36.3 C 09/04/23 09:17  Pulse 76 09/04/23 09:21  Resp 22 09/04/23 09:21  SpO2 100 % 09/04/23 09:21  Vitals shown include unfiled device data.  Last Pain:  Vitals:   09/04/23 0642  TempSrc:   PainSc: 0-No pain         Complications: There were no known notable events for this encounter.

## 2023-09-04 NOTE — Anesthesia Procedure Notes (Signed)
 Procedure Name: Intubation Date/Time: 09/04/2023 7:37 AM  Performed by: Jerrye Sharper, MDPre-anesthesia Checklist: Patient identified, Emergency Drugs available, Suction available and Patient being monitored Patient Re-evaluated:Patient Re-evaluated prior to induction Oxygen Delivery Method: Circle system utilized Preoxygenation: Pre-oxygenation with 100% oxygen Induction Type: IV induction Ventilation: Mask ventilation without difficulty Laryngoscope Size: Mac and 3 Grade View: Grade I Tube type: Oral Tube size: 7.0 mm Number of attempts: 1 Airway Equipment and Method: Stylet and Oral airway Placement Confirmation: ETT inserted through vocal cords under direct vision, positive ETCO2 and breath sounds checked- equal and bilateral Secured at: 22 cm Tube secured with: Tape Dental Injury: Teeth and Oropharynx as per pre-operative assessment

## 2023-09-04 NOTE — Op Note (Signed)
 09/04/2023 7:16 AM  PATIENT: Adriana Carr  38 y.o. female  Patient Care Team: Pcp, No as PCP - General  PRE-OPERATIVE DIAGNOSIS: symptomatic cholelithiasis  POST-OPERATIVE DIAGNOSIS: cholelithiasis with chronic cholecystitis  PROCEDURE: laparoscopic choleystectomy with ICG  SURGEON: Orie Silversmith, MD  ASSISTANT: None  ANESTHESIA: General endotracheal  EBL: Minimal  DRAINS: None  SPECIMEN: Gallbladder  COUNTS: Sponge, needle and instrument counts were reported correct x2 at the conclusion of the operation  DISPOSITION: PACU in satisfactory condition  COMPLICATIONS: None  FINDINGS: Chronically inflamed gallbladder with ICG confirmation of fluorescence through CBD and into small bowel. Critical view of safety achieved prior to clip placements. Posterior cysti artery identified and clipped.  DESCRIPTION:  Preoperative indocyanine green  was administered in preoperative holding. The patient was identified & brought into the operating room. She was then positioned supine on the OR table. SCDs were in place and active during the entire case. She then underwent general endotracheal anesthesia. Pressure points were padded. Hair on the abdomen was clipped by the OR team. The abdomen was prepped and draped in the standard sterile fashion. Antibiotics were administered. A surgical timeout was performed and confirmed our plan.  A small incision was made in the LUQ at Palmer's point and a veress needle was inserted. Air was aspirated and subsequent positive drop test. Abdomen then insufflated to . A periumbilical incision was then made and a 5mm trocar optiview using a 30 degree scope was inserted and the abdomen was entered under direct visualization. Inspection confirmed no evidence of trocar or veress site complications. The veress was then removed.    The patient was then positioned in reverse Trendelenburg with slight left side down. A 12 mm supxiphoid trocar was placed under direct  visualization and  two additional 5mm trocars were placed along the right subcostal line - one 5mm port in mid subcostal region, another 5mm port in the right flank near the anterior axillary line.  The liver and gallbladder were inspected. The gallbladder fundus was grasped and elevated cephalad. An additional grasper was then placed on the infundibulum of the gallbladder and the infundibulum was retracted laterally. Staying high on the gallbladder, the peritoneum on both sides of the gallbladder was opened with hook cautery. Gentle blunt dissection was then employed with a Wellsite geologist working down into [employer]. The cystic duct was identified and carefully circumferentially dissected. The cystic artery was also identified and carefully circumferentially dissected. The space between the cystic artery and hepatocystic plate was developed such that a good view of the liver could be seen through a window medial to the cystic artery. The triangle of Calot had been cleared of all fibrofatty tissue. At this point, a critical view of safety was achieved and the only structures visualized was the skeletonized cystic duct laterally, the skeletonized cystic artery and the liver through the window medial to the artery. There was a posterior cystic artery was noted  Attention turned to infrared fluorescent cholangiography with indocyanine green  which was visualized within the common hepatic duct, common bile duct, cystic duct and small bowel.  The cystic duct and artery and posterior artery were clipped with 2 hemolock clips on the patient side and 1 clip on the specimen side. The cystic duct and artery were then divided. The gallbladder was then freed from its remaining attachments to the liver using electrocautery and placed into an endocatch bag. The RUQ was gently irrigated with sterile saline. Hemostasis was then verified. The clips were in good position;  the gallbladder fossa was dry. The rest of the  abdomen was inspected no injury nor bleeding elsewhere was identified.  The endocatch bag containing the gallbladder was then removed from the subxiphoid port site and passed off as specimen. The subxiphoid port fascia was then closed in a figured of eight fashion with 0 vicryl using a suture passer. The RUQ ports were removed under direct visualization and noted to be hemostatic.SABRA The fascia was palpated and noted to be completely closed. The abdomen was then desufflated and the periumbilical trocar removed. The skin of all incision sites was approximated with 4-0 monocryl subcuticular suture and dermabond applied. The patient was then awakened from anesthesia, extubated, and transferred to a stretcher for transport to PACU in satisfactory condition.  Instrument, sponge, and needle counts were correct at closure and at the conclusion of the case.   Orie Silversmith, MD [facility]

## 2023-09-04 NOTE — Addendum Note (Signed)
 Addendum  created 09/04/23 1030 by Almetta Toribio LABOR, RN   Intraprocedure Meds edited

## 2023-09-05 ENCOUNTER — Encounter (HOSPITAL_COMMUNITY): Payer: Self-pay | Admitting: General Surgery

## 2023-09-05 LAB — SURGICAL PATHOLOGY
